# Patient Record
Sex: Male | Born: 1940
Health system: Southern US, Community
[De-identification: ages and names within clinical notes are randomized; demographics above are authoritative.]

## PROBLEM LIST (undated history)

## (undated) DIAGNOSIS — I219 Acute myocardial infarction, unspecified: Secondary | ICD-10-CM

## (undated) DIAGNOSIS — K2270 Barrett's esophagus without dysplasia: Secondary | ICD-10-CM

## (undated) DIAGNOSIS — I714 Abdominal aortic aneurysm, without rupture: Secondary | ICD-10-CM

## (undated) HISTORY — DX: Acute myocardial infarction, unspecified: I21.9

## (undated) HISTORY — DX: Abdominal aortic aneurysm, without rupture: I71.4

## (undated) HISTORY — PX: OTHER SURGICAL HISTORY: SHX169

## (undated) HISTORY — DX: Barrett's esophagus without dysplasia: K22.70

## (undated) HISTORY — PX: RENAL ARTERY STENT: SHX2321

## (undated) HISTORY — PX: ABDOMINAL AORTIC ANEURYSM REPAIR: SUR1152

---

## 2011-02-01 DIAGNOSIS — R69 Illness, unspecified: Secondary | ICD-10-CM | POA: Insufficient documentation

## 2011-07-23 DIAGNOSIS — E782 Mixed hyperlipidemia: Secondary | ICD-10-CM | POA: Diagnosis not present

## 2011-07-23 DIAGNOSIS — I714 Abdominal aortic aneurysm, without rupture: Secondary | ICD-10-CM | POA: Diagnosis not present

## 2011-07-23 DIAGNOSIS — I10 Essential (primary) hypertension: Secondary | ICD-10-CM | POA: Diagnosis not present

## 2011-07-23 DIAGNOSIS — M546 Pain in thoracic spine: Secondary | ICD-10-CM | POA: Diagnosis not present

## 2011-07-25 DIAGNOSIS — M546 Pain in thoracic spine: Secondary | ICD-10-CM | POA: Diagnosis not present

## 2011-07-25 DIAGNOSIS — M19019 Primary osteoarthritis, unspecified shoulder: Secondary | ICD-10-CM | POA: Diagnosis not present

## 2011-07-25 DIAGNOSIS — IMO0002 Reserved for concepts with insufficient information to code with codable children: Secondary | ICD-10-CM | POA: Diagnosis not present

## 2011-07-25 DIAGNOSIS — M25519 Pain in unspecified shoulder: Secondary | ICD-10-CM | POA: Diagnosis not present

## 2011-08-01 DIAGNOSIS — M19019 Primary osteoarthritis, unspecified shoulder: Secondary | ICD-10-CM | POA: Diagnosis not present

## 2011-08-01 DIAGNOSIS — M67919 Unspecified disorder of synovium and tendon, unspecified shoulder: Secondary | ICD-10-CM | POA: Diagnosis not present

## 2011-08-01 DIAGNOSIS — M25519 Pain in unspecified shoulder: Secondary | ICD-10-CM | POA: Diagnosis not present

## 2011-08-01 DIAGNOSIS — M659 Synovitis and tenosynovitis, unspecified: Secondary | ICD-10-CM | POA: Diagnosis not present

## 2011-08-01 DIAGNOSIS — M719 Bursopathy, unspecified: Secondary | ICD-10-CM | POA: Diagnosis not present

## 2011-08-20 DIAGNOSIS — M25519 Pain in unspecified shoulder: Secondary | ICD-10-CM | POA: Diagnosis not present

## 2011-08-20 DIAGNOSIS — M19019 Primary osteoarthritis, unspecified shoulder: Secondary | ICD-10-CM | POA: Diagnosis not present

## 2011-11-28 DIAGNOSIS — L821 Other seborrheic keratosis: Secondary | ICD-10-CM | POA: Diagnosis not present

## 2011-11-28 DIAGNOSIS — H6980 Other specified disorders of Eustachian tube, unspecified ear: Secondary | ICD-10-CM | POA: Diagnosis not present

## 2011-11-28 DIAGNOSIS — Z23 Encounter for immunization: Secondary | ICD-10-CM | POA: Diagnosis not present

## 2011-11-28 DIAGNOSIS — I10 Essential (primary) hypertension: Secondary | ICD-10-CM | POA: Diagnosis not present

## 2011-11-28 DIAGNOSIS — I714 Abdominal aortic aneurysm, without rupture: Secondary | ICD-10-CM | POA: Diagnosis not present

## 2011-11-28 DIAGNOSIS — R42 Dizziness and giddiness: Secondary | ICD-10-CM | POA: Diagnosis not present

## 2011-11-28 DIAGNOSIS — H6990 Unspecified Eustachian tube disorder, unspecified ear: Secondary | ICD-10-CM | POA: Diagnosis not present

## 2011-12-05 DIAGNOSIS — I714 Abdominal aortic aneurysm, without rupture: Secondary | ICD-10-CM | POA: Diagnosis not present

## 2012-01-01 DIAGNOSIS — I498 Other specified cardiac arrhythmias: Secondary | ICD-10-CM | POA: Diagnosis not present

## 2012-01-01 DIAGNOSIS — I252 Old myocardial infarction: Secondary | ICD-10-CM | POA: Diagnosis not present

## 2012-01-01 DIAGNOSIS — I714 Abdominal aortic aneurysm, without rupture: Secondary | ICD-10-CM | POA: Diagnosis not present

## 2012-01-01 DIAGNOSIS — I10 Essential (primary) hypertension: Secondary | ICD-10-CM | POA: Diagnosis not present

## 2012-01-01 DIAGNOSIS — I447 Left bundle-branch block, unspecified: Secondary | ICD-10-CM | POA: Diagnosis not present

## 2012-01-23 DIAGNOSIS — I6529 Occlusion and stenosis of unspecified carotid artery: Secondary | ICD-10-CM | POA: Diagnosis not present

## 2012-01-23 DIAGNOSIS — I70219 Atherosclerosis of native arteries of extremities with intermittent claudication, unspecified extremity: Secondary | ICD-10-CM | POA: Diagnosis not present

## 2012-01-23 DIAGNOSIS — I714 Abdominal aortic aneurysm, without rupture: Secondary | ICD-10-CM | POA: Diagnosis not present

## 2012-01-23 DIAGNOSIS — I252 Old myocardial infarction: Secondary | ICD-10-CM | POA: Diagnosis not present

## 2012-01-23 DIAGNOSIS — R0989 Other specified symptoms and signs involving the circulatory and respiratory systems: Secondary | ICD-10-CM | POA: Diagnosis not present

## 2012-01-23 DIAGNOSIS — I701 Atherosclerosis of renal artery: Secondary | ICD-10-CM | POA: Diagnosis not present

## 2012-01-23 DIAGNOSIS — R9431 Abnormal electrocardiogram [ECG] [EKG]: Secondary | ICD-10-CM | POA: Diagnosis not present

## 2012-01-23 DIAGNOSIS — I498 Other specified cardiac arrhythmias: Secondary | ICD-10-CM | POA: Diagnosis not present

## 2012-02-05 DIAGNOSIS — E7800 Pure hypercholesterolemia, unspecified: Secondary | ICD-10-CM | POA: Diagnosis not present

## 2012-02-05 DIAGNOSIS — Z8709 Personal history of other diseases of the respiratory system: Secondary | ICD-10-CM | POA: Diagnosis not present

## 2012-02-05 DIAGNOSIS — I701 Atherosclerosis of renal artery: Secondary | ICD-10-CM | POA: Diagnosis not present

## 2012-02-05 DIAGNOSIS — Z8679 Personal history of other diseases of the circulatory system: Secondary | ICD-10-CM | POA: Diagnosis not present

## 2012-02-05 DIAGNOSIS — I10 Essential (primary) hypertension: Secondary | ICD-10-CM | POA: Diagnosis not present

## 2012-02-05 DIAGNOSIS — I714 Abdominal aortic aneurysm, without rupture: Secondary | ICD-10-CM | POA: Diagnosis not present

## 2012-02-13 DIAGNOSIS — I714 Abdominal aortic aneurysm, without rupture: Secondary | ICD-10-CM | POA: Diagnosis not present

## 2012-02-13 DIAGNOSIS — I701 Atherosclerosis of renal artery: Secondary | ICD-10-CM | POA: Diagnosis not present

## 2012-02-13 DIAGNOSIS — Q278 Other specified congenital malformations of peripheral vascular system: Secondary | ICD-10-CM | POA: Diagnosis not present

## 2012-02-22 DIAGNOSIS — I10 Essential (primary) hypertension: Secondary | ICD-10-CM | POA: Diagnosis not present

## 2012-02-22 DIAGNOSIS — I2510 Atherosclerotic heart disease of native coronary artery without angina pectoris: Secondary | ICD-10-CM | POA: Diagnosis not present

## 2012-02-22 DIAGNOSIS — I701 Atherosclerosis of renal artery: Secondary | ICD-10-CM | POA: Diagnosis not present

## 2012-02-22 DIAGNOSIS — J449 Chronic obstructive pulmonary disease, unspecified: Secondary | ICD-10-CM | POA: Diagnosis not present

## 2012-02-22 DIAGNOSIS — I714 Abdominal aortic aneurysm, without rupture: Secondary | ICD-10-CM | POA: Diagnosis not present

## 2012-02-22 DIAGNOSIS — E785 Hyperlipidemia, unspecified: Secondary | ICD-10-CM | POA: Diagnosis not present

## 2012-02-27 DIAGNOSIS — I714 Abdominal aortic aneurysm, without rupture, unspecified: Secondary | ICD-10-CM | POA: Diagnosis not present

## 2012-02-27 DIAGNOSIS — I2510 Atherosclerotic heart disease of native coronary artery without angina pectoris: Secondary | ICD-10-CM | POA: Diagnosis present

## 2012-02-27 DIAGNOSIS — I10 Essential (primary) hypertension: Secondary | ICD-10-CM | POA: Diagnosis present

## 2012-02-27 DIAGNOSIS — I701 Atherosclerosis of renal artery: Secondary | ICD-10-CM | POA: Diagnosis present

## 2012-02-27 DIAGNOSIS — E785 Hyperlipidemia, unspecified: Secondary | ICD-10-CM | POA: Diagnosis present

## 2012-02-27 DIAGNOSIS — J449 Chronic obstructive pulmonary disease, unspecified: Secondary | ICD-10-CM | POA: Diagnosis present

## 2012-04-08 DIAGNOSIS — I714 Abdominal aortic aneurysm, without rupture, unspecified: Secondary | ICD-10-CM | POA: Diagnosis not present

## 2012-04-08 DIAGNOSIS — Z9889 Other specified postprocedural states: Secondary | ICD-10-CM | POA: Diagnosis not present

## 2012-04-08 DIAGNOSIS — N2889 Other specified disorders of kidney and ureter: Secondary | ICD-10-CM | POA: Diagnosis not present

## 2012-04-11 DIAGNOSIS — M999 Biomechanical lesion, unspecified: Secondary | ICD-10-CM | POA: Diagnosis not present

## 2012-04-11 DIAGNOSIS — S335XXA Sprain of ligaments of lumbar spine, initial encounter: Secondary | ICD-10-CM | POA: Diagnosis not present

## 2012-06-02 DIAGNOSIS — Z86010 Personal history of colonic polyps: Secondary | ICD-10-CM | POA: Diagnosis not present

## 2012-06-02 DIAGNOSIS — K2270 Barrett's esophagus without dysplasia: Secondary | ICD-10-CM | POA: Diagnosis not present

## 2012-06-02 DIAGNOSIS — M9981 Other biomechanical lesions of cervical region: Secondary | ICD-10-CM | POA: Diagnosis not present

## 2012-06-02 DIAGNOSIS — M999 Biomechanical lesion, unspecified: Secondary | ICD-10-CM | POA: Diagnosis not present

## 2012-06-25 DIAGNOSIS — K2270 Barrett's esophagus without dysplasia: Secondary | ICD-10-CM | POA: Diagnosis not present

## 2012-06-25 DIAGNOSIS — H539 Unspecified visual disturbance: Secondary | ICD-10-CM | POA: Diagnosis not present

## 2012-06-25 DIAGNOSIS — Z85038 Personal history of other malignant neoplasm of large intestine: Secondary | ICD-10-CM | POA: Diagnosis not present

## 2012-06-25 DIAGNOSIS — R0989 Other specified symptoms and signs involving the circulatory and respiratory systems: Secondary | ICD-10-CM | POA: Diagnosis not present

## 2012-06-27 DIAGNOSIS — I6529 Occlusion and stenosis of unspecified carotid artery: Secondary | ICD-10-CM | POA: Diagnosis not present

## 2012-06-27 DIAGNOSIS — R0989 Other specified symptoms and signs involving the circulatory and respiratory systems: Secondary | ICD-10-CM | POA: Diagnosis not present

## 2012-06-27 DIAGNOSIS — G459 Transient cerebral ischemic attack, unspecified: Secondary | ICD-10-CM | POA: Diagnosis not present

## 2012-07-01 DIAGNOSIS — K5730 Diverticulosis of large intestine without perforation or abscess without bleeding: Secondary | ICD-10-CM | POA: Diagnosis not present

## 2012-07-01 DIAGNOSIS — D130 Benign neoplasm of esophagus: Secondary | ICD-10-CM | POA: Diagnosis not present

## 2012-07-01 DIAGNOSIS — D126 Benign neoplasm of colon, unspecified: Secondary | ICD-10-CM | POA: Diagnosis not present

## 2012-07-01 DIAGNOSIS — Z09 Encounter for follow-up examination after completed treatment for conditions other than malignant neoplasm: Secondary | ICD-10-CM | POA: Diagnosis not present

## 2012-07-01 DIAGNOSIS — Z85038 Personal history of other malignant neoplasm of large intestine: Secondary | ICD-10-CM | POA: Diagnosis not present

## 2012-07-01 DIAGNOSIS — K2270 Barrett's esophagus without dysplasia: Secondary | ICD-10-CM | POA: Diagnosis not present

## 2012-07-10 DIAGNOSIS — K2270 Barrett's esophagus without dysplasia: Secondary | ICD-10-CM | POA: Diagnosis not present

## 2012-07-10 DIAGNOSIS — Z85038 Personal history of other malignant neoplasm of large intestine: Secondary | ICD-10-CM | POA: Diagnosis not present

## 2012-07-10 DIAGNOSIS — M545 Low back pain: Secondary | ICD-10-CM | POA: Diagnosis not present

## 2012-07-10 DIAGNOSIS — D126 Benign neoplasm of colon, unspecified: Secondary | ICD-10-CM | POA: Diagnosis not present

## 2012-07-17 DIAGNOSIS — M545 Low back pain: Secondary | ICD-10-CM | POA: Diagnosis not present

## 2012-07-24 DIAGNOSIS — M5126 Other intervertebral disc displacement, lumbar region: Secondary | ICD-10-CM | POA: Diagnosis not present

## 2012-07-24 DIAGNOSIS — M48061 Spinal stenosis, lumbar region without neurogenic claudication: Secondary | ICD-10-CM | POA: Diagnosis not present

## 2012-07-24 DIAGNOSIS — I714 Abdominal aortic aneurysm, without rupture: Secondary | ICD-10-CM | POA: Diagnosis not present

## 2012-07-24 DIAGNOSIS — M5137 Other intervertebral disc degeneration, lumbosacral region: Secondary | ICD-10-CM | POA: Diagnosis not present

## 2012-07-24 DIAGNOSIS — M545 Low back pain: Secondary | ICD-10-CM | POA: Diagnosis not present

## 2012-07-24 DIAGNOSIS — M47817 Spondylosis without myelopathy or radiculopathy, lumbosacral region: Secondary | ICD-10-CM | POA: Diagnosis not present

## 2012-07-30 DIAGNOSIS — M5126 Other intervertebral disc displacement, lumbar region: Secondary | ICD-10-CM | POA: Diagnosis not present

## 2012-07-30 DIAGNOSIS — M48061 Spinal stenosis, lumbar region without neurogenic claudication: Secondary | ICD-10-CM | POA: Diagnosis not present

## 2012-07-30 DIAGNOSIS — IMO0002 Reserved for concepts with insufficient information to code with codable children: Secondary | ICD-10-CM | POA: Diagnosis not present

## 2012-08-06 DIAGNOSIS — Z09 Encounter for follow-up examination after completed treatment for conditions other than malignant neoplasm: Secondary | ICD-10-CM | POA: Diagnosis not present

## 2012-08-06 DIAGNOSIS — IMO0002 Reserved for concepts with insufficient information to code with codable children: Secondary | ICD-10-CM | POA: Diagnosis not present

## 2012-08-15 DIAGNOSIS — IMO0002 Reserved for concepts with insufficient information to code with codable children: Secondary | ICD-10-CM | POA: Diagnosis not present

## 2012-08-15 DIAGNOSIS — M5126 Other intervertebral disc displacement, lumbar region: Secondary | ICD-10-CM | POA: Diagnosis not present

## 2012-08-15 DIAGNOSIS — M48061 Spinal stenosis, lumbar region without neurogenic claudication: Secondary | ICD-10-CM | POA: Diagnosis not present

## 2012-08-27 DIAGNOSIS — IMO0002 Reserved for concepts with insufficient information to code with codable children: Secondary | ICD-10-CM | POA: Diagnosis not present

## 2012-09-02 DIAGNOSIS — M48061 Spinal stenosis, lumbar region without neurogenic claudication: Secondary | ICD-10-CM | POA: Diagnosis not present

## 2012-09-02 DIAGNOSIS — M5126 Other intervertebral disc displacement, lumbar region: Secondary | ICD-10-CM | POA: Diagnosis not present

## 2012-09-02 DIAGNOSIS — IMO0002 Reserved for concepts with insufficient information to code with codable children: Secondary | ICD-10-CM | POA: Diagnosis not present

## 2013-02-10 DIAGNOSIS — I10 Essential (primary) hypertension: Secondary | ICD-10-CM | POA: Diagnosis not present

## 2013-02-10 DIAGNOSIS — M25519 Pain in unspecified shoulder: Secondary | ICD-10-CM | POA: Diagnosis not present

## 2013-02-10 DIAGNOSIS — E785 Hyperlipidemia, unspecified: Secondary | ICD-10-CM | POA: Diagnosis not present

## 2013-03-17 DIAGNOSIS — I10 Essential (primary) hypertension: Secondary | ICD-10-CM | POA: Diagnosis not present

## 2013-03-17 DIAGNOSIS — E785 Hyperlipidemia, unspecified: Secondary | ICD-10-CM | POA: Diagnosis not present

## 2013-03-17 DIAGNOSIS — I2510 Atherosclerotic heart disease of native coronary artery without angina pectoris: Secondary | ICD-10-CM | POA: Diagnosis not present

## 2013-03-24 DIAGNOSIS — I10 Essential (primary) hypertension: Secondary | ICD-10-CM | POA: Diagnosis not present

## 2013-03-31 DIAGNOSIS — C50029 Malignant neoplasm of nipple and areola, unspecified male breast: Secondary | ICD-10-CM | POA: Diagnosis not present

## 2013-04-01 DIAGNOSIS — J00 Acute nasopharyngitis [common cold]: Secondary | ICD-10-CM | POA: Diagnosis not present

## 2013-04-16 DIAGNOSIS — K2270 Barrett's esophagus without dysplasia: Secondary | ICD-10-CM | POA: Diagnosis not present

## 2013-04-16 DIAGNOSIS — D249 Benign neoplasm of unspecified breast: Secondary | ICD-10-CM | POA: Diagnosis not present

## 2013-04-16 DIAGNOSIS — D235 Other benign neoplasm of skin of trunk: Secondary | ICD-10-CM | POA: Diagnosis not present

## 2013-04-16 DIAGNOSIS — K219 Gastro-esophageal reflux disease without esophagitis: Secondary | ICD-10-CM | POA: Diagnosis not present

## 2013-06-17 DIAGNOSIS — I10 Essential (primary) hypertension: Secondary | ICD-10-CM | POA: Diagnosis not present

## 2013-06-17 DIAGNOSIS — I2510 Atherosclerotic heart disease of native coronary artery without angina pectoris: Secondary | ICD-10-CM | POA: Diagnosis not present

## 2013-06-25 DIAGNOSIS — I10 Essential (primary) hypertension: Secondary | ICD-10-CM | POA: Diagnosis not present

## 2013-08-18 DIAGNOSIS — R42 Dizziness and giddiness: Secondary | ICD-10-CM | POA: Diagnosis not present

## 2013-09-07 DIAGNOSIS — L02519 Cutaneous abscess of unspecified hand: Secondary | ICD-10-CM | POA: Diagnosis not present

## 2013-09-07 DIAGNOSIS — L03119 Cellulitis of unspecified part of limb: Secondary | ICD-10-CM | POA: Diagnosis not present

## 2013-09-25 DIAGNOSIS — I10 Essential (primary) hypertension: Secondary | ICD-10-CM | POA: Diagnosis not present

## 2013-09-25 DIAGNOSIS — Z23 Encounter for immunization: Secondary | ICD-10-CM | POA: Diagnosis not present

## 2013-09-25 DIAGNOSIS — I2510 Atherosclerotic heart disease of native coronary artery without angina pectoris: Secondary | ICD-10-CM | POA: Diagnosis not present

## 2013-11-12 DIAGNOSIS — K219 Gastro-esophageal reflux disease without esophagitis: Secondary | ICD-10-CM | POA: Diagnosis not present

## 2013-11-12 DIAGNOSIS — K2270 Barrett's esophagus without dysplasia: Secondary | ICD-10-CM | POA: Diagnosis not present

## 2013-11-12 DIAGNOSIS — I714 Abdominal aortic aneurysm, without rupture: Secondary | ICD-10-CM | POA: Diagnosis not present

## 2013-11-17 DIAGNOSIS — I714 Abdominal aortic aneurysm, without rupture: Secondary | ICD-10-CM | POA: Diagnosis not present

## 2013-12-14 ENCOUNTER — Encounter: Payer: Self-pay | Admitting: Surgery

## 2013-12-14 DIAGNOSIS — I10 Essential (primary) hypertension: Secondary | ICD-10-CM | POA: Diagnosis not present

## 2013-12-14 DIAGNOSIS — K449 Diaphragmatic hernia without obstruction or gangrene: Secondary | ICD-10-CM | POA: Diagnosis not present

## 2013-12-14 DIAGNOSIS — K2270 Barrett's esophagus without dysplasia: Secondary | ICD-10-CM | POA: Diagnosis not present

## 2013-12-14 DIAGNOSIS — I2510 Atherosclerotic heart disease of native coronary artery without angina pectoris: Secondary | ICD-10-CM | POA: Diagnosis not present

## 2013-12-14 DIAGNOSIS — K219 Gastro-esophageal reflux disease without esophagitis: Secondary | ICD-10-CM | POA: Diagnosis not present

## 2013-12-14 DIAGNOSIS — Z87891 Personal history of nicotine dependence: Secondary | ICD-10-CM | POA: Diagnosis not present

## 2013-12-23 ENCOUNTER — Encounter: Payer: Self-pay | Admitting: Vascular Surgery

## 2013-12-24 ENCOUNTER — Encounter: Payer: Self-pay | Admitting: Vascular Surgery

## 2013-12-24 ENCOUNTER — Ambulatory Visit (INDEPENDENT_AMBULATORY_CARE_PROVIDER_SITE_OTHER): Payer: Medicare Other | Admitting: Vascular Surgery

## 2013-12-24 VITALS — BP 157/82 | HR 73 | Ht 72.0 in | Wt 213.0 lb

## 2013-12-24 DIAGNOSIS — Z48812 Encounter for surgical aftercare following surgery on the circulatory system: Secondary | ICD-10-CM | POA: Diagnosis not present

## 2013-12-24 DIAGNOSIS — I714 Abdominal aortic aneurysm, without rupture, unspecified: Secondary | ICD-10-CM

## 2013-12-24 DIAGNOSIS — I6523 Occlusion and stenosis of bilateral carotid arteries: Secondary | ICD-10-CM

## 2013-12-24 DIAGNOSIS — K2270 Barrett's esophagus without dysplasia: Secondary | ICD-10-CM

## 2013-12-24 HISTORY — DX: Abdominal aortic aneurysm, without rupture: I71.4

## 2013-12-24 HISTORY — DX: Abdominal aortic aneurysm, without rupture, unspecified: I71.40

## 2013-12-24 NOTE — Addendum Note (Signed)
Addended by: Mena Goes on: 12/24/2013 11:28 AM   Modules accepted: Orders

## 2013-12-24 NOTE — Progress Notes (Addendum)
[facility] [place] HISTORY AND PHYSICAL   History of Present Illness:  Patient is a 73 y.o. year old male who presents for evaluation of abdominal aortic aneurysm. The patient had aneurysm stent graft repair and renal artery stenting in [place] in February 2014. He recently moved to the area and is here for establishment of care. He denies any abdominal or back pain. He does complain of chronic numbness in the anterior portion of his left thigh. He also has occasional numbness and tingling in his left foot.  Other medical problems include history of Barrett's esophagus and coronary artery disease with remote silent myocardial infarction.   Past Medical History  Diagnosis Date  . Myocardial infarction   . AAA (abdominal aortic aneurysm)   . Barrett's esophagus      Past Surgical History  Procedure Laterality Date  . Evar    . Renal artery stent      Social History History  Substance Use Topics  . Smoking status: Former Smoker    Types: Cigarettes    Quit date: 01/16/2007  . Smokeless tobacco: Not on file  . Alcohol Use: 3.6 - 4.8 oz/week    3-4 Glasses of wine, 3-4 Cans of beer per week    Family History Family History  Problem Relation Age of Onset  . Diabetes Mother   . Hypertension Mother   . Varicose Veins Mother   . Heart disease Mother     before age 11    Allergies  Not on File   Current Outpatient Prescriptions  Medication Sig Dispense Refill  . amLODipine (NORVASC) 10 MG tablet Take 10 mg by mouth daily.  5  . atorvastatin (LIPITOR) 40 MG tablet Take 40 mg by mouth daily.  6  . losartan-hydrochlorothiazide (HYZAAR) 50-12.5 MG per tablet Take 2 tablets by mouth daily.  6  . omeprazole (PRILOSEC) 20 MG capsule Take 20 mg by mouth every morning.  10   No current facility-administered medications for this visit.    ROS:   General:  No weight loss, Fever, chills  HEENT: No recent headaches, no nasal bleeding, no visual  changes, no sore throat  Neurologic: No dizziness, blackouts, seizures. No recent symptoms of stroke or mini- stroke. No recent episodes of slurred speech, or temporary blindness.  Cardiac: No recent episodes of chest pain/pressure, no shortness of breath at rest.  No shortness of breath with exertion.  Denies history of atrial fibrillation or irregular heartbeat  Vascular: No history of rest pain in feet.  No history of claudication.  No history of non-healing ulcer, No history of DVT   Pulmonary: No home oxygen, no productive cough, no hemoptysis,  No asthma or wheezing  Musculoskeletal:  [ ]  Arthritis, [ ]  Low back pain,  [ ]  Joint pain  Hematologic:No history of hypercoagulable state.  No history of easy bleeding.  No history of anemia  Gastrointestinal: No hematochezia or melena,  + gastroesophageal reflux, no trouble swallowing  Urinary: [ ]  chronic Kidney disease, [ ]  on HD - [ ]  MWF or [ ]  TTHS, [ ]  Burning with urination, [ ]  Frequent urination, [ ]  Difficulty urinating;   Skin: No rashes  Psychological: No history of anxiety,  No history of depression   Physical Examination  Filed Vitals:   12/24/13 0905  BP: 157/82  Pulse: 73  Height: 6' (1.829 m)  Weight: 213 lb (96.616 kg)  SpO2: 96%    Body mass index is 28.88 kg/(m^2).  General:  Alert and oriented, no acute distress HEENT: Normal Neck: 2+ carotid pulses, soft left carotid bruit, no JVD Pulmonary: Clear to auscultation bilaterally Cardiac: Regular Rate and Rhythm without murmur Abdomen: Soft, non-tender, non-distended, no mass, no scars Skin: No rash Extremity Pulses:  2+ radial, brachial, femoral, right 2+ dorsalis pedis, left 2+ posterior tibial pulses Musculoskeletal: No deformity or edema  Neurologic: Upper and lower extremity motor 5/5 and symmetric, 5 x 5 cm area just above the patella left leg with slightly decreased sensation to light touch  DATA:  Ultrasound from [facility] from several  weeks ago is reviewed today. This shows a 5 x 4.2 cm abdominal aortic aneurysm with a stent graft in place. I reviewed these images today.   ASSESSMENT:  Status post aneurysm stent graft repair. Left carotid bruit.   PLAN:  We will obtain his operative note records from [facility]. We will also obtain his carotid duplex scan from [facility] from 2014.  He'll return in 6 months for a CT angiogram of the abdomen and pelvis to define the specific anatomy of his stent graft. He will also have a carotid duplex exam at that appointment.  Consideration also needs to be given for starting the patient on daily aspirin with his history of coronary disease.  Ruta Hinds, MD [facility] [place] Office: [phone] Pager: [phone]  Addendum: Medical records from patient's [facility] reviewed on December 16.   Carotid occlusive disease: Patient had a carotid duplex scan dated 06/27/2012 which showed less than 50% right and left internal carotid artery stenosis with antegrade vertebral flow  Abdominal aortic aneurysm: Patient had a Gore Excluder stent graft placed on 02/27/2012. This was a 26 x 14.5 x 14 cm Gore Excluder graft. The contralateral limb was a 16 x 14.5 x 12 cm length limb and ipsilateral extension was placed which was a 16 mm x 18 mm x 9.5 cm length limb   CT Angio post procedure on 04/07/2012 showed aneurysm sac 4.9 x 4.8 cm with a patent left renal artery stent no endoleak, left inferior renal pole infarction after coverage of accessory left renal artery.  Patient's aneurysm was 5 x 4.9 preoperatively.  Renal artery stenosis left renal artery stent placed on 02/13/2012.  Ruta Hinds, MD [facility] [place] Office: [phone] Pager: [phone]

## 2013-12-28 DIAGNOSIS — E785 Hyperlipidemia, unspecified: Secondary | ICD-10-CM | POA: Diagnosis not present

## 2013-12-28 DIAGNOSIS — I10 Essential (primary) hypertension: Secondary | ICD-10-CM | POA: Diagnosis not present

## 2014-03-29 DIAGNOSIS — M25512 Pain in left shoulder: Secondary | ICD-10-CM | POA: Diagnosis not present

## 2014-03-29 DIAGNOSIS — K209 Esophagitis, unspecified: Secondary | ICD-10-CM | POA: Diagnosis not present

## 2014-03-29 DIAGNOSIS — I10 Essential (primary) hypertension: Secondary | ICD-10-CM | POA: Diagnosis not present

## 2014-03-29 DIAGNOSIS — E669 Obesity, unspecified: Secondary | ICD-10-CM | POA: Diagnosis not present

## 2014-06-01 DIAGNOSIS — J310 Chronic rhinitis: Secondary | ICD-10-CM | POA: Diagnosis not present

## 2014-06-21 DIAGNOSIS — J310 Chronic rhinitis: Secondary | ICD-10-CM | POA: Diagnosis not present

## 2014-07-27 DIAGNOSIS — J310 Chronic rhinitis: Secondary | ICD-10-CM | POA: Diagnosis not present

## 2014-07-27 DIAGNOSIS — J342 Deviated nasal septum: Secondary | ICD-10-CM | POA: Diagnosis not present

## 2014-07-27 DIAGNOSIS — J3489 Other specified disorders of nose and nasal sinuses: Secondary | ICD-10-CM | POA: Diagnosis not present

## 2014-07-27 DIAGNOSIS — J343 Hypertrophy of nasal turbinates: Secondary | ICD-10-CM | POA: Diagnosis not present

## 2014-08-04 DIAGNOSIS — I2510 Atherosclerotic heart disease of native coronary artery without angina pectoris: Secondary | ICD-10-CM | POA: Diagnosis not present

## 2014-08-04 DIAGNOSIS — J310 Chronic rhinitis: Secondary | ICD-10-CM | POA: Diagnosis not present

## 2014-08-04 DIAGNOSIS — E785 Hyperlipidemia, unspecified: Secondary | ICD-10-CM | POA: Diagnosis not present

## 2014-08-04 DIAGNOSIS — J342 Deviated nasal septum: Secondary | ICD-10-CM | POA: Diagnosis not present

## 2014-08-04 DIAGNOSIS — Z87891 Personal history of nicotine dependence: Secondary | ICD-10-CM | POA: Diagnosis not present

## 2014-08-04 DIAGNOSIS — K219 Gastro-esophageal reflux disease without esophagitis: Secondary | ICD-10-CM | POA: Diagnosis not present

## 2014-08-04 DIAGNOSIS — Z79899 Other long term (current) drug therapy: Secondary | ICD-10-CM | POA: Diagnosis not present

## 2014-08-04 DIAGNOSIS — J343 Hypertrophy of nasal turbinates: Secondary | ICD-10-CM | POA: Diagnosis not present

## 2014-08-04 DIAGNOSIS — I10 Essential (primary) hypertension: Secondary | ICD-10-CM | POA: Diagnosis not present

## 2014-08-04 DIAGNOSIS — J3489 Other specified disorders of nose and nasal sinuses: Secondary | ICD-10-CM | POA: Diagnosis not present

## 2014-10-04 DIAGNOSIS — E785 Hyperlipidemia, unspecified: Secondary | ICD-10-CM | POA: Diagnosis not present

## 2014-10-04 DIAGNOSIS — Z23 Encounter for immunization: Secondary | ICD-10-CM | POA: Diagnosis not present

## 2014-10-04 DIAGNOSIS — I10 Essential (primary) hypertension: Secondary | ICD-10-CM | POA: Diagnosis not present

## 2014-10-04 DIAGNOSIS — K209 Esophagitis, unspecified: Secondary | ICD-10-CM | POA: Diagnosis not present

## 2014-12-30 ENCOUNTER — Encounter (HOSPITAL_COMMUNITY): Payer: Medicare Other

## 2014-12-30 ENCOUNTER — Other Ambulatory Visit: Payer: Medicare Other

## 2014-12-30 ENCOUNTER — Ambulatory Visit: Payer: Medicare Other | Admitting: Vascular Surgery

## 2015-02-09 DIAGNOSIS — E669 Obesity, unspecified: Secondary | ICD-10-CM | POA: Diagnosis not present

## 2015-02-09 DIAGNOSIS — K209 Esophagitis, unspecified: Secondary | ICD-10-CM | POA: Diagnosis not present

## 2015-02-09 DIAGNOSIS — E785 Hyperlipidemia, unspecified: Secondary | ICD-10-CM | POA: Diagnosis not present

## 2015-02-09 DIAGNOSIS — R05 Cough: Secondary | ICD-10-CM | POA: Diagnosis not present

## 2015-02-09 DIAGNOSIS — I10 Essential (primary) hypertension: Secondary | ICD-10-CM | POA: Diagnosis not present

## 2015-02-09 DIAGNOSIS — Z6829 Body mass index (BMI) 29.0-29.9, adult: Secondary | ICD-10-CM | POA: Diagnosis not present

## 2015-02-09 DIAGNOSIS — I2510 Atherosclerotic heart disease of native coronary artery without angina pectoris: Secondary | ICD-10-CM | POA: Diagnosis not present

## 2015-02-09 DIAGNOSIS — Z125 Encounter for screening for malignant neoplasm of prostate: Secondary | ICD-10-CM | POA: Diagnosis not present

## 2015-02-18 ENCOUNTER — Encounter: Payer: Self-pay | Admitting: Vascular Surgery

## 2015-02-21 ENCOUNTER — Inpatient Hospital Stay: Admission: RE | Admit: 2015-02-21 | Payer: Medicare Other | Source: Ambulatory Visit

## 2015-02-22 DIAGNOSIS — J209 Acute bronchitis, unspecified: Secondary | ICD-10-CM | POA: Diagnosis not present

## 2015-02-23 DIAGNOSIS — R918 Other nonspecific abnormal finding of lung field: Secondary | ICD-10-CM | POA: Diagnosis not present

## 2015-02-23 DIAGNOSIS — R05 Cough: Secondary | ICD-10-CM | POA: Diagnosis not present

## 2015-02-23 DIAGNOSIS — R509 Fever, unspecified: Secondary | ICD-10-CM | POA: Diagnosis not present

## 2015-02-23 DIAGNOSIS — J849 Interstitial pulmonary disease, unspecified: Secondary | ICD-10-CM | POA: Diagnosis not present

## 2015-02-24 ENCOUNTER — Encounter (HOSPITAL_COMMUNITY): Payer: Medicare Other

## 2015-02-24 ENCOUNTER — Ambulatory Visit: Payer: Medicare Other | Admitting: Vascular Surgery

## 2015-03-03 DIAGNOSIS — R062 Wheezing: Secondary | ICD-10-CM | POA: Diagnosis not present

## 2015-03-03 DIAGNOSIS — J209 Acute bronchitis, unspecified: Secondary | ICD-10-CM | POA: Diagnosis not present

## 2015-03-03 DIAGNOSIS — R918 Other nonspecific abnormal finding of lung field: Secondary | ICD-10-CM | POA: Diagnosis not present

## 2015-03-03 DIAGNOSIS — R05 Cough: Secondary | ICD-10-CM | POA: Diagnosis not present

## 2015-03-03 DIAGNOSIS — I700 Atherosclerosis of aorta: Secondary | ICD-10-CM | POA: Diagnosis not present

## 2015-03-04 ENCOUNTER — Encounter: Payer: Self-pay | Admitting: Family Medicine

## 2015-03-11 DIAGNOSIS — R0602 Shortness of breath: Secondary | ICD-10-CM | POA: Diagnosis not present

## 2015-04-28 DIAGNOSIS — S46111A Strain of muscle, fascia and tendon of long head of biceps, right arm, initial encounter: Secondary | ICD-10-CM | POA: Diagnosis not present

## 2015-04-28 DIAGNOSIS — M79601 Pain in right arm: Secondary | ICD-10-CM | POA: Diagnosis not present

## 2015-04-28 DIAGNOSIS — M25521 Pain in right elbow: Secondary | ICD-10-CM | POA: Diagnosis not present

## 2015-05-04 DIAGNOSIS — X58XXXA Exposure to other specified factors, initial encounter: Secondary | ICD-10-CM | POA: Diagnosis not present

## 2015-05-04 DIAGNOSIS — M25521 Pain in right elbow: Secondary | ICD-10-CM | POA: Diagnosis not present

## 2015-05-04 DIAGNOSIS — M79601 Pain in right arm: Secondary | ICD-10-CM | POA: Diagnosis not present

## 2015-05-04 DIAGNOSIS — S46211A Strain of muscle, fascia and tendon of other parts of biceps, right arm, initial encounter: Secondary | ICD-10-CM | POA: Diagnosis not present

## 2015-05-04 DIAGNOSIS — M25421 Effusion, right elbow: Secondary | ICD-10-CM | POA: Diagnosis not present

## 2015-05-06 DIAGNOSIS — S46111A Strain of muscle, fascia and tendon of long head of biceps, right arm, initial encounter: Secondary | ICD-10-CM | POA: Diagnosis not present

## 2015-05-06 DIAGNOSIS — M79601 Pain in right arm: Secondary | ICD-10-CM | POA: Diagnosis not present

## 2015-05-17 DIAGNOSIS — M5441 Lumbago with sciatica, right side: Secondary | ICD-10-CM | POA: Diagnosis not present

## 2015-05-27 DIAGNOSIS — S46111A Strain of muscle, fascia and tendon of long head of biceps, right arm, initial encounter: Secondary | ICD-10-CM | POA: Diagnosis not present

## 2015-05-27 DIAGNOSIS — M79601 Pain in right arm: Secondary | ICD-10-CM | POA: Diagnosis not present

## 2015-06-20 DIAGNOSIS — G8929 Other chronic pain: Secondary | ICD-10-CM | POA: Diagnosis not present

## 2015-06-20 DIAGNOSIS — M5441 Lumbago with sciatica, right side: Secondary | ICD-10-CM | POA: Diagnosis not present

## 2015-06-20 DIAGNOSIS — I10 Essential (primary) hypertension: Secondary | ICD-10-CM | POA: Diagnosis not present

## 2015-06-20 DIAGNOSIS — E785 Hyperlipidemia, unspecified: Secondary | ICD-10-CM | POA: Diagnosis not present

## 2015-06-20 DIAGNOSIS — R911 Solitary pulmonary nodule: Secondary | ICD-10-CM | POA: Diagnosis not present

## 2015-06-29 DIAGNOSIS — R05 Cough: Secondary | ICD-10-CM | POA: Diagnosis not present

## 2015-06-29 DIAGNOSIS — R918 Other nonspecific abnormal finding of lung field: Secondary | ICD-10-CM | POA: Diagnosis not present

## 2015-06-29 DIAGNOSIS — R911 Solitary pulmonary nodule: Secondary | ICD-10-CM | POA: Diagnosis not present

## 2015-06-29 DIAGNOSIS — J40 Bronchitis, not specified as acute or chronic: Secondary | ICD-10-CM | POA: Diagnosis not present

## 2015-07-20 DIAGNOSIS — M545 Low back pain: Secondary | ICD-10-CM | POA: Diagnosis not present

## 2015-08-16 DIAGNOSIS — G8929 Other chronic pain: Secondary | ICD-10-CM | POA: Diagnosis not present

## 2015-08-16 DIAGNOSIS — M5416 Radiculopathy, lumbar region: Secondary | ICD-10-CM | POA: Diagnosis not present

## 2015-08-16 DIAGNOSIS — M5442 Lumbago with sciatica, left side: Secondary | ICD-10-CM | POA: Diagnosis not present

## 2015-08-16 DIAGNOSIS — Z87891 Personal history of nicotine dependence: Secondary | ICD-10-CM | POA: Diagnosis not present

## 2015-08-16 DIAGNOSIS — I10 Essential (primary) hypertension: Secondary | ICD-10-CM | POA: Diagnosis not present

## 2015-08-26 DIAGNOSIS — M5416 Radiculopathy, lumbar region: Secondary | ICD-10-CM | POA: Diagnosis not present

## 2015-08-26 DIAGNOSIS — G8929 Other chronic pain: Secondary | ICD-10-CM | POA: Diagnosis not present

## 2015-08-26 DIAGNOSIS — M5126 Other intervertebral disc displacement, lumbar region: Secondary | ICD-10-CM | POA: Diagnosis not present

## 2015-08-26 DIAGNOSIS — M4806 Spinal stenosis, lumbar region: Secondary | ICD-10-CM | POA: Diagnosis not present

## 2015-08-26 DIAGNOSIS — M5442 Lumbago with sciatica, left side: Secondary | ICD-10-CM | POA: Diagnosis not present

## 2015-08-30 DIAGNOSIS — Z87891 Personal history of nicotine dependence: Secondary | ICD-10-CM | POA: Diagnosis not present

## 2015-08-30 DIAGNOSIS — G8929 Other chronic pain: Secondary | ICD-10-CM | POA: Diagnosis not present

## 2015-08-30 DIAGNOSIS — M4806 Spinal stenosis, lumbar region: Secondary | ICD-10-CM | POA: Diagnosis not present

## 2015-08-30 DIAGNOSIS — M1288 Other specific arthropathies, not elsewhere classified, other specified site: Secondary | ICD-10-CM | POA: Diagnosis not present

## 2015-08-30 DIAGNOSIS — M5441 Lumbago with sciatica, right side: Secondary | ICD-10-CM | POA: Diagnosis not present

## 2015-08-30 DIAGNOSIS — M5416 Radiculopathy, lumbar region: Secondary | ICD-10-CM | POA: Diagnosis not present

## 2015-08-30 DIAGNOSIS — M5442 Lumbago with sciatica, left side: Secondary | ICD-10-CM | POA: Diagnosis not present

## 2015-09-02 DIAGNOSIS — M5441 Lumbago with sciatica, right side: Secondary | ICD-10-CM | POA: Diagnosis not present

## 2015-09-02 DIAGNOSIS — I10 Essential (primary) hypertension: Secondary | ICD-10-CM | POA: Diagnosis not present

## 2015-09-02 DIAGNOSIS — G8929 Other chronic pain: Secondary | ICD-10-CM | POA: Diagnosis not present

## 2015-09-02 DIAGNOSIS — Z6829 Body mass index (BMI) 29.0-29.9, adult: Secondary | ICD-10-CM | POA: Diagnosis not present

## 2015-09-02 DIAGNOSIS — Z1389 Encounter for screening for other disorder: Secondary | ICD-10-CM | POA: Diagnosis not present

## 2015-09-02 DIAGNOSIS — I2510 Atherosclerotic heart disease of native coronary artery without angina pectoris: Secondary | ICD-10-CM | POA: Diagnosis not present

## 2015-09-02 DIAGNOSIS — K209 Esophagitis, unspecified: Secondary | ICD-10-CM | POA: Diagnosis not present

## 2015-09-02 DIAGNOSIS — R351 Nocturia: Secondary | ICD-10-CM | POA: Diagnosis not present

## 2015-09-13 DIAGNOSIS — M4806 Spinal stenosis, lumbar region: Secondary | ICD-10-CM | POA: Diagnosis not present

## 2015-09-15 ENCOUNTER — Other Ambulatory Visit: Payer: Self-pay

## 2015-09-30 DIAGNOSIS — M5416 Radiculopathy, lumbar region: Secondary | ICD-10-CM | POA: Diagnosis not present

## 2015-09-30 DIAGNOSIS — M5136 Other intervertebral disc degeneration, lumbar region: Secondary | ICD-10-CM | POA: Diagnosis not present

## 2015-09-30 DIAGNOSIS — M4806 Spinal stenosis, lumbar region: Secondary | ICD-10-CM | POA: Diagnosis not present

## 2015-10-07 DIAGNOSIS — M19012 Primary osteoarthritis, left shoulder: Secondary | ICD-10-CM | POA: Diagnosis not present

## 2015-10-12 DIAGNOSIS — R52 Pain, unspecified: Secondary | ICD-10-CM | POA: Diagnosis not present

## 2015-10-12 DIAGNOSIS — I517 Cardiomegaly: Secondary | ICD-10-CM | POA: Diagnosis not present

## 2015-10-12 DIAGNOSIS — M79609 Pain in unspecified limb: Secondary | ICD-10-CM | POA: Diagnosis not present

## 2015-10-12 DIAGNOSIS — Z01810 Encounter for preprocedural cardiovascular examination: Secondary | ICD-10-CM | POA: Diagnosis not present

## 2015-10-12 DIAGNOSIS — Z79899 Other long term (current) drug therapy: Secondary | ICD-10-CM | POA: Diagnosis not present

## 2015-10-12 DIAGNOSIS — Z01818 Encounter for other preprocedural examination: Secondary | ICD-10-CM | POA: Diagnosis not present

## 2015-10-12 DIAGNOSIS — E559 Vitamin D deficiency, unspecified: Secondary | ICD-10-CM | POA: Diagnosis not present

## 2015-10-14 DIAGNOSIS — Z6829 Body mass index (BMI) 29.0-29.9, adult: Secondary | ICD-10-CM | POA: Diagnosis not present

## 2015-10-14 DIAGNOSIS — I2510 Atherosclerotic heart disease of native coronary artery without angina pectoris: Secondary | ICD-10-CM | POA: Diagnosis not present

## 2015-10-14 DIAGNOSIS — M25512 Pain in left shoulder: Secondary | ICD-10-CM | POA: Diagnosis not present

## 2015-10-14 DIAGNOSIS — I447 Left bundle-branch block, unspecified: Secondary | ICD-10-CM | POA: Diagnosis not present

## 2015-10-14 DIAGNOSIS — M549 Dorsalgia, unspecified: Secondary | ICD-10-CM | POA: Diagnosis not present

## 2015-10-15 DIAGNOSIS — M25412 Effusion, left shoulder: Secondary | ICD-10-CM | POA: Diagnosis not present

## 2015-10-15 DIAGNOSIS — M25512 Pain in left shoulder: Secondary | ICD-10-CM | POA: Diagnosis not present

## 2015-10-21 DIAGNOSIS — M48061 Spinal stenosis, lumbar region without neurogenic claudication: Secondary | ICD-10-CM | POA: Diagnosis not present

## 2015-10-21 DIAGNOSIS — M5416 Radiculopathy, lumbar region: Secondary | ICD-10-CM | POA: Diagnosis not present

## 2015-10-21 DIAGNOSIS — M5136 Other intervertebral disc degeneration, lumbar region: Secondary | ICD-10-CM | POA: Diagnosis not present

## 2015-10-25 DIAGNOSIS — R9431 Abnormal electrocardiogram [ECG] [EKG]: Secondary | ICD-10-CM | POA: Diagnosis not present

## 2015-10-25 DIAGNOSIS — Z01818 Encounter for other preprocedural examination: Secondary | ICD-10-CM | POA: Diagnosis not present

## 2015-10-28 DIAGNOSIS — Z8679 Personal history of other diseases of the circulatory system: Secondary | ICD-10-CM

## 2015-10-28 DIAGNOSIS — I209 Angina pectoris, unspecified: Secondary | ICD-10-CM | POA: Diagnosis not present

## 2015-10-28 DIAGNOSIS — Z6829 Body mass index (BMI) 29.0-29.9, adult: Secondary | ICD-10-CM | POA: Diagnosis not present

## 2015-10-28 DIAGNOSIS — I429 Cardiomyopathy, unspecified: Secondary | ICD-10-CM | POA: Diagnosis not present

## 2015-10-28 DIAGNOSIS — I2510 Atherosclerotic heart disease of native coronary artery without angina pectoris: Secondary | ICD-10-CM | POA: Diagnosis not present

## 2015-10-28 DIAGNOSIS — E785 Hyperlipidemia, unspecified: Secondary | ICD-10-CM | POA: Insufficient documentation

## 2015-10-28 DIAGNOSIS — I10 Essential (primary) hypertension: Secondary | ICD-10-CM | POA: Diagnosis not present

## 2015-10-28 DIAGNOSIS — Z9889 Other specified postprocedural states: Secondary | ICD-10-CM

## 2015-10-28 HISTORY — DX: Hyperlipidemia, unspecified: E78.5

## 2015-10-28 HISTORY — DX: Personal history of other diseases of the circulatory system: Z86.79

## 2015-11-04 DIAGNOSIS — I25119 Atherosclerotic heart disease of native coronary artery with unspecified angina pectoris: Secondary | ICD-10-CM | POA: Diagnosis not present

## 2015-11-04 DIAGNOSIS — I10 Essential (primary) hypertension: Secondary | ICD-10-CM | POA: Diagnosis not present

## 2015-11-04 DIAGNOSIS — I429 Cardiomyopathy, unspecified: Secondary | ICD-10-CM | POA: Diagnosis not present

## 2015-11-04 DIAGNOSIS — Z87891 Personal history of nicotine dependence: Secondary | ICD-10-CM | POA: Diagnosis not present

## 2015-11-04 DIAGNOSIS — E785 Hyperlipidemia, unspecified: Secondary | ICD-10-CM | POA: Diagnosis not present

## 2015-11-04 DIAGNOSIS — R0789 Other chest pain: Secondary | ICD-10-CM | POA: Diagnosis not present

## 2015-11-04 DIAGNOSIS — Z79899 Other long term (current) drug therapy: Secondary | ICD-10-CM | POA: Diagnosis not present

## 2015-11-04 DIAGNOSIS — Z9889 Other specified postprocedural states: Secondary | ICD-10-CM | POA: Diagnosis not present

## 2015-11-04 DIAGNOSIS — I501 Left ventricular failure: Secondary | ICD-10-CM | POA: Diagnosis not present

## 2015-11-04 DIAGNOSIS — I25118 Atherosclerotic heart disease of native coronary artery with other forms of angina pectoris: Secondary | ICD-10-CM | POA: Diagnosis not present

## 2015-12-05 DIAGNOSIS — Z6828 Body mass index (BMI) 28.0-28.9, adult: Secondary | ICD-10-CM | POA: Diagnosis not present

## 2015-12-05 DIAGNOSIS — I2510 Atherosclerotic heart disease of native coronary artery without angina pectoris: Secondary | ICD-10-CM | POA: Diagnosis not present

## 2015-12-05 DIAGNOSIS — E785 Hyperlipidemia, unspecified: Secondary | ICD-10-CM | POA: Diagnosis not present

## 2015-12-05 DIAGNOSIS — Z9889 Other specified postprocedural states: Secondary | ICD-10-CM | POA: Diagnosis not present

## 2015-12-05 DIAGNOSIS — Z8679 Personal history of other diseases of the circulatory system: Secondary | ICD-10-CM | POA: Diagnosis not present

## 2015-12-05 DIAGNOSIS — I429 Cardiomyopathy, unspecified: Secondary | ICD-10-CM | POA: Diagnosis not present

## 2015-12-05 DIAGNOSIS — I10 Essential (primary) hypertension: Secondary | ICD-10-CM | POA: Diagnosis not present

## 2015-12-05 DIAGNOSIS — I209 Angina pectoris, unspecified: Secondary | ICD-10-CM | POA: Diagnosis not present

## 2016-01-31 DIAGNOSIS — E785 Hyperlipidemia, unspecified: Secondary | ICD-10-CM | POA: Diagnosis not present

## 2016-01-31 DIAGNOSIS — I209 Angina pectoris, unspecified: Secondary | ICD-10-CM | POA: Diagnosis not present

## 2016-01-31 DIAGNOSIS — I10 Essential (primary) hypertension: Secondary | ICD-10-CM | POA: Diagnosis not present

## 2016-01-31 DIAGNOSIS — I2510 Atherosclerotic heart disease of native coronary artery without angina pectoris: Secondary | ICD-10-CM | POA: Diagnosis not present

## 2016-01-31 DIAGNOSIS — I429 Cardiomyopathy, unspecified: Secondary | ICD-10-CM | POA: Diagnosis not present

## 2016-01-31 DIAGNOSIS — Z8679 Personal history of other diseases of the circulatory system: Secondary | ICD-10-CM | POA: Diagnosis not present

## 2016-01-31 DIAGNOSIS — Z9889 Other specified postprocedural states: Secondary | ICD-10-CM | POA: Diagnosis not present

## 2016-02-06 DIAGNOSIS — Z6828 Body mass index (BMI) 28.0-28.9, adult: Secondary | ICD-10-CM | POA: Diagnosis not present

## 2016-02-06 DIAGNOSIS — I10 Essential (primary) hypertension: Secondary | ICD-10-CM | POA: Diagnosis not present

## 2016-02-06 DIAGNOSIS — E785 Hyperlipidemia, unspecified: Secondary | ICD-10-CM | POA: Diagnosis not present

## 2016-02-06 DIAGNOSIS — I2510 Atherosclerotic heart disease of native coronary artery without angina pectoris: Secondary | ICD-10-CM | POA: Diagnosis not present

## 2016-02-06 DIAGNOSIS — Z8679 Personal history of other diseases of the circulatory system: Secondary | ICD-10-CM | POA: Diagnosis not present

## 2016-02-06 DIAGNOSIS — Z9889 Other specified postprocedural states: Secondary | ICD-10-CM | POA: Diagnosis not present

## 2016-03-14 DIAGNOSIS — E785 Hyperlipidemia, unspecified: Secondary | ICD-10-CM | POA: Diagnosis not present

## 2016-03-14 DIAGNOSIS — I2510 Atherosclerotic heart disease of native coronary artery without angina pectoris: Secondary | ICD-10-CM | POA: Diagnosis not present

## 2016-03-14 DIAGNOSIS — I10 Essential (primary) hypertension: Secondary | ICD-10-CM | POA: Diagnosis not present

## 2016-04-04 DIAGNOSIS — I429 Cardiomyopathy, unspecified: Secondary | ICD-10-CM | POA: Diagnosis not present

## 2016-04-04 DIAGNOSIS — I10 Essential (primary) hypertension: Secondary | ICD-10-CM | POA: Diagnosis not present

## 2016-04-04 DIAGNOSIS — J309 Allergic rhinitis, unspecified: Secondary | ICD-10-CM | POA: Diagnosis not present

## 2016-04-04 DIAGNOSIS — K2270 Barrett's esophagus without dysplasia: Secondary | ICD-10-CM | POA: Diagnosis not present

## 2016-04-18 DIAGNOSIS — M48062 Spinal stenosis, lumbar region with neurogenic claudication: Secondary | ICD-10-CM | POA: Diagnosis not present

## 2016-04-19 DIAGNOSIS — M48062 Spinal stenosis, lumbar region with neurogenic claudication: Secondary | ICD-10-CM | POA: Diagnosis not present

## 2016-04-19 DIAGNOSIS — M48061 Spinal stenosis, lumbar region without neurogenic claudication: Secondary | ICD-10-CM | POA: Diagnosis not present

## 2016-04-24 DIAGNOSIS — M48062 Spinal stenosis, lumbar region with neurogenic claudication: Secondary | ICD-10-CM | POA: Diagnosis not present

## 2016-05-07 DIAGNOSIS — K22719 Barrett's esophagus with dysplasia, unspecified: Secondary | ICD-10-CM | POA: Diagnosis not present

## 2016-05-07 DIAGNOSIS — M47817 Spondylosis without myelopathy or radiculopathy, lumbosacral region: Secondary | ICD-10-CM | POA: Diagnosis not present

## 2016-05-07 DIAGNOSIS — I10 Essential (primary) hypertension: Secondary | ICD-10-CM | POA: Diagnosis not present

## 2016-05-07 DIAGNOSIS — M48062 Spinal stenosis, lumbar region with neurogenic claudication: Secondary | ICD-10-CM | POA: Diagnosis not present

## 2016-05-07 DIAGNOSIS — J309 Allergic rhinitis, unspecified: Secondary | ICD-10-CM | POA: Diagnosis not present

## 2016-05-07 DIAGNOSIS — I255 Ischemic cardiomyopathy: Secondary | ICD-10-CM | POA: Diagnosis not present

## 2016-05-28 DIAGNOSIS — J3089 Other allergic rhinitis: Secondary | ICD-10-CM | POA: Diagnosis not present

## 2016-06-13 DIAGNOSIS — I10 Essential (primary) hypertension: Secondary | ICD-10-CM | POA: Diagnosis not present

## 2016-06-13 DIAGNOSIS — J309 Allergic rhinitis, unspecified: Secondary | ICD-10-CM | POA: Diagnosis not present

## 2016-06-13 DIAGNOSIS — K22719 Barrett's esophagus with dysplasia, unspecified: Secondary | ICD-10-CM | POA: Diagnosis not present

## 2016-07-31 DIAGNOSIS — M545 Low back pain: Secondary | ICD-10-CM | POA: Diagnosis not present

## 2016-07-31 DIAGNOSIS — M9901 Segmental and somatic dysfunction of cervical region: Secondary | ICD-10-CM | POA: Diagnosis not present

## 2016-07-31 DIAGNOSIS — M9902 Segmental and somatic dysfunction of thoracic region: Secondary | ICD-10-CM | POA: Diagnosis not present

## 2016-07-31 DIAGNOSIS — M9903 Segmental and somatic dysfunction of lumbar region: Secondary | ICD-10-CM | POA: Diagnosis not present

## 2016-07-31 DIAGNOSIS — M50322 Other cervical disc degeneration at C5-C6 level: Secondary | ICD-10-CM | POA: Diagnosis not present

## 2016-07-31 DIAGNOSIS — M5383 Other specified dorsopathies, cervicothoracic region: Secondary | ICD-10-CM | POA: Diagnosis not present

## 2016-07-31 DIAGNOSIS — M542 Cervicalgia: Secondary | ICD-10-CM | POA: Diagnosis not present

## 2016-08-02 DIAGNOSIS — M5383 Other specified dorsopathies, cervicothoracic region: Secondary | ICD-10-CM | POA: Diagnosis not present

## 2016-08-02 DIAGNOSIS — M545 Low back pain: Secondary | ICD-10-CM | POA: Diagnosis not present

## 2016-08-02 DIAGNOSIS — M50322 Other cervical disc degeneration at C5-C6 level: Secondary | ICD-10-CM | POA: Diagnosis not present

## 2016-08-02 DIAGNOSIS — M542 Cervicalgia: Secondary | ICD-10-CM | POA: Diagnosis not present

## 2016-08-02 DIAGNOSIS — M9901 Segmental and somatic dysfunction of cervical region: Secondary | ICD-10-CM | POA: Diagnosis not present

## 2016-08-02 DIAGNOSIS — M9902 Segmental and somatic dysfunction of thoracic region: Secondary | ICD-10-CM | POA: Diagnosis not present

## 2016-08-02 DIAGNOSIS — M9903 Segmental and somatic dysfunction of lumbar region: Secondary | ICD-10-CM | POA: Diagnosis not present

## 2016-08-06 DIAGNOSIS — M9902 Segmental and somatic dysfunction of thoracic region: Secondary | ICD-10-CM | POA: Diagnosis not present

## 2016-08-06 DIAGNOSIS — M50322 Other cervical disc degeneration at C5-C6 level: Secondary | ICD-10-CM | POA: Diagnosis not present

## 2016-08-06 DIAGNOSIS — M5383 Other specified dorsopathies, cervicothoracic region: Secondary | ICD-10-CM | POA: Diagnosis not present

## 2016-08-06 DIAGNOSIS — M542 Cervicalgia: Secondary | ICD-10-CM | POA: Diagnosis not present

## 2016-08-06 DIAGNOSIS — M9901 Segmental and somatic dysfunction of cervical region: Secondary | ICD-10-CM | POA: Diagnosis not present

## 2016-08-06 DIAGNOSIS — M9903 Segmental and somatic dysfunction of lumbar region: Secondary | ICD-10-CM | POA: Diagnosis not present

## 2016-08-06 DIAGNOSIS — M545 Low back pain: Secondary | ICD-10-CM | POA: Diagnosis not present

## 2016-08-08 DIAGNOSIS — M542 Cervicalgia: Secondary | ICD-10-CM | POA: Diagnosis not present

## 2016-08-08 DIAGNOSIS — M9901 Segmental and somatic dysfunction of cervical region: Secondary | ICD-10-CM | POA: Diagnosis not present

## 2016-08-08 DIAGNOSIS — M545 Low back pain: Secondary | ICD-10-CM | POA: Diagnosis not present

## 2016-08-08 DIAGNOSIS — M9902 Segmental and somatic dysfunction of thoracic region: Secondary | ICD-10-CM | POA: Diagnosis not present

## 2016-08-08 DIAGNOSIS — M5383 Other specified dorsopathies, cervicothoracic region: Secondary | ICD-10-CM | POA: Diagnosis not present

## 2016-08-08 DIAGNOSIS — M9903 Segmental and somatic dysfunction of lumbar region: Secondary | ICD-10-CM | POA: Diagnosis not present

## 2016-08-08 DIAGNOSIS — M50322 Other cervical disc degeneration at C5-C6 level: Secondary | ICD-10-CM | POA: Diagnosis not present

## 2016-08-09 DIAGNOSIS — M545 Low back pain: Secondary | ICD-10-CM | POA: Diagnosis not present

## 2016-08-09 DIAGNOSIS — M9903 Segmental and somatic dysfunction of lumbar region: Secondary | ICD-10-CM | POA: Diagnosis not present

## 2016-08-09 DIAGNOSIS — M542 Cervicalgia: Secondary | ICD-10-CM | POA: Diagnosis not present

## 2016-08-09 DIAGNOSIS — M9901 Segmental and somatic dysfunction of cervical region: Secondary | ICD-10-CM | POA: Diagnosis not present

## 2016-08-09 DIAGNOSIS — M9902 Segmental and somatic dysfunction of thoracic region: Secondary | ICD-10-CM | POA: Diagnosis not present

## 2016-08-09 DIAGNOSIS — M5383 Other specified dorsopathies, cervicothoracic region: Secondary | ICD-10-CM | POA: Diagnosis not present

## 2016-08-09 DIAGNOSIS — M50322 Other cervical disc degeneration at C5-C6 level: Secondary | ICD-10-CM | POA: Diagnosis not present

## 2016-08-13 DIAGNOSIS — M9902 Segmental and somatic dysfunction of thoracic region: Secondary | ICD-10-CM | POA: Diagnosis not present

## 2016-08-13 DIAGNOSIS — M545 Low back pain: Secondary | ICD-10-CM | POA: Diagnosis not present

## 2016-08-13 DIAGNOSIS — M5383 Other specified dorsopathies, cervicothoracic region: Secondary | ICD-10-CM | POA: Diagnosis not present

## 2016-08-13 DIAGNOSIS — M50322 Other cervical disc degeneration at C5-C6 level: Secondary | ICD-10-CM | POA: Diagnosis not present

## 2016-08-13 DIAGNOSIS — M542 Cervicalgia: Secondary | ICD-10-CM | POA: Diagnosis not present

## 2016-08-13 DIAGNOSIS — M9901 Segmental and somatic dysfunction of cervical region: Secondary | ICD-10-CM | POA: Diagnosis not present

## 2016-08-13 DIAGNOSIS — M9903 Segmental and somatic dysfunction of lumbar region: Secondary | ICD-10-CM | POA: Diagnosis not present

## 2016-08-14 DIAGNOSIS — M9902 Segmental and somatic dysfunction of thoracic region: Secondary | ICD-10-CM | POA: Diagnosis not present

## 2016-08-14 DIAGNOSIS — M50322 Other cervical disc degeneration at C5-C6 level: Secondary | ICD-10-CM | POA: Diagnosis not present

## 2016-08-14 DIAGNOSIS — M9901 Segmental and somatic dysfunction of cervical region: Secondary | ICD-10-CM | POA: Diagnosis not present

## 2016-08-14 DIAGNOSIS — M542 Cervicalgia: Secondary | ICD-10-CM | POA: Diagnosis not present

## 2016-08-14 DIAGNOSIS — M9903 Segmental and somatic dysfunction of lumbar region: Secondary | ICD-10-CM | POA: Diagnosis not present

## 2016-08-14 DIAGNOSIS — M545 Low back pain: Secondary | ICD-10-CM | POA: Diagnosis not present

## 2016-08-14 DIAGNOSIS — M5383 Other specified dorsopathies, cervicothoracic region: Secondary | ICD-10-CM | POA: Diagnosis not present

## 2016-08-15 DIAGNOSIS — M545 Low back pain: Secondary | ICD-10-CM | POA: Diagnosis not present

## 2016-08-15 DIAGNOSIS — M50322 Other cervical disc degeneration at C5-C6 level: Secondary | ICD-10-CM | POA: Diagnosis not present

## 2016-08-15 DIAGNOSIS — M9901 Segmental and somatic dysfunction of cervical region: Secondary | ICD-10-CM | POA: Diagnosis not present

## 2016-08-15 DIAGNOSIS — M5383 Other specified dorsopathies, cervicothoracic region: Secondary | ICD-10-CM | POA: Diagnosis not present

## 2016-08-15 DIAGNOSIS — M9903 Segmental and somatic dysfunction of lumbar region: Secondary | ICD-10-CM | POA: Diagnosis not present

## 2016-08-15 DIAGNOSIS — M542 Cervicalgia: Secondary | ICD-10-CM | POA: Diagnosis not present

## 2016-08-15 DIAGNOSIS — M9902 Segmental and somatic dysfunction of thoracic region: Secondary | ICD-10-CM | POA: Diagnosis not present

## 2016-08-16 DIAGNOSIS — M47817 Spondylosis without myelopathy or radiculopathy, lumbosacral region: Secondary | ICD-10-CM | POA: Diagnosis not present

## 2016-08-16 DIAGNOSIS — M48062 Spinal stenosis, lumbar region with neurogenic claudication: Secondary | ICD-10-CM | POA: Diagnosis not present

## 2016-08-20 DIAGNOSIS — M50322 Other cervical disc degeneration at C5-C6 level: Secondary | ICD-10-CM | POA: Diagnosis not present

## 2016-08-20 DIAGNOSIS — M5383 Other specified dorsopathies, cervicothoracic region: Secondary | ICD-10-CM | POA: Diagnosis not present

## 2016-08-20 DIAGNOSIS — M9902 Segmental and somatic dysfunction of thoracic region: Secondary | ICD-10-CM | POA: Diagnosis not present

## 2016-08-20 DIAGNOSIS — M542 Cervicalgia: Secondary | ICD-10-CM | POA: Diagnosis not present

## 2016-08-20 DIAGNOSIS — M9903 Segmental and somatic dysfunction of lumbar region: Secondary | ICD-10-CM | POA: Diagnosis not present

## 2016-08-20 DIAGNOSIS — M545 Low back pain: Secondary | ICD-10-CM | POA: Diagnosis not present

## 2016-08-20 DIAGNOSIS — M9901 Segmental and somatic dysfunction of cervical region: Secondary | ICD-10-CM | POA: Diagnosis not present

## 2016-08-22 DIAGNOSIS — M542 Cervicalgia: Secondary | ICD-10-CM | POA: Diagnosis not present

## 2016-08-22 DIAGNOSIS — M5383 Other specified dorsopathies, cervicothoracic region: Secondary | ICD-10-CM | POA: Diagnosis not present

## 2016-08-22 DIAGNOSIS — M545 Low back pain: Secondary | ICD-10-CM | POA: Diagnosis not present

## 2016-08-22 DIAGNOSIS — M9901 Segmental and somatic dysfunction of cervical region: Secondary | ICD-10-CM | POA: Diagnosis not present

## 2016-08-22 DIAGNOSIS — M50322 Other cervical disc degeneration at C5-C6 level: Secondary | ICD-10-CM | POA: Diagnosis not present

## 2016-08-22 DIAGNOSIS — M9902 Segmental and somatic dysfunction of thoracic region: Secondary | ICD-10-CM | POA: Diagnosis not present

## 2016-08-22 DIAGNOSIS — M9903 Segmental and somatic dysfunction of lumbar region: Secondary | ICD-10-CM | POA: Diagnosis not present

## 2016-09-05 DIAGNOSIS — M5383 Other specified dorsopathies, cervicothoracic region: Secondary | ICD-10-CM | POA: Diagnosis not present

## 2016-09-05 DIAGNOSIS — M542 Cervicalgia: Secondary | ICD-10-CM | POA: Diagnosis not present

## 2016-09-05 DIAGNOSIS — M9901 Segmental and somatic dysfunction of cervical region: Secondary | ICD-10-CM | POA: Diagnosis not present

## 2016-09-05 DIAGNOSIS — M9903 Segmental and somatic dysfunction of lumbar region: Secondary | ICD-10-CM | POA: Diagnosis not present

## 2016-09-05 DIAGNOSIS — M9902 Segmental and somatic dysfunction of thoracic region: Secondary | ICD-10-CM | POA: Diagnosis not present

## 2016-09-05 DIAGNOSIS — M50322 Other cervical disc degeneration at C5-C6 level: Secondary | ICD-10-CM | POA: Diagnosis not present

## 2016-09-05 DIAGNOSIS — M545 Low back pain: Secondary | ICD-10-CM | POA: Diagnosis not present

## 2016-09-14 DIAGNOSIS — J309 Allergic rhinitis, unspecified: Secondary | ICD-10-CM | POA: Diagnosis not present

## 2016-09-14 DIAGNOSIS — K22719 Barrett's esophagus with dysplasia, unspecified: Secondary | ICD-10-CM | POA: Diagnosis not present

## 2016-09-14 DIAGNOSIS — I255 Ischemic cardiomyopathy: Secondary | ICD-10-CM | POA: Diagnosis not present

## 2016-09-14 DIAGNOSIS — I10 Essential (primary) hypertension: Secondary | ICD-10-CM | POA: Diagnosis not present

## 2016-09-19 DIAGNOSIS — M9903 Segmental and somatic dysfunction of lumbar region: Secondary | ICD-10-CM | POA: Diagnosis not present

## 2016-09-19 DIAGNOSIS — M9901 Segmental and somatic dysfunction of cervical region: Secondary | ICD-10-CM | POA: Diagnosis not present

## 2016-09-19 DIAGNOSIS — M5383 Other specified dorsopathies, cervicothoracic region: Secondary | ICD-10-CM | POA: Diagnosis not present

## 2016-09-19 DIAGNOSIS — M50322 Other cervical disc degeneration at C5-C6 level: Secondary | ICD-10-CM | POA: Diagnosis not present

## 2016-09-19 DIAGNOSIS — M545 Low back pain: Secondary | ICD-10-CM | POA: Diagnosis not present

## 2016-09-19 DIAGNOSIS — M9902 Segmental and somatic dysfunction of thoracic region: Secondary | ICD-10-CM | POA: Diagnosis not present

## 2016-09-19 DIAGNOSIS — M542 Cervicalgia: Secondary | ICD-10-CM | POA: Diagnosis not present

## 2016-11-16 DIAGNOSIS — K210 Gastro-esophageal reflux disease with esophagitis: Secondary | ICD-10-CM | POA: Diagnosis not present

## 2016-11-16 DIAGNOSIS — K2270 Barrett's esophagus without dysplasia: Secondary | ICD-10-CM | POA: Diagnosis not present

## 2016-11-19 DIAGNOSIS — E785 Hyperlipidemia, unspecified: Secondary | ICD-10-CM | POA: Diagnosis not present

## 2016-11-19 DIAGNOSIS — Z955 Presence of coronary angioplasty implant and graft: Secondary | ICD-10-CM | POA: Diagnosis not present

## 2016-11-19 DIAGNOSIS — I252 Old myocardial infarction: Secondary | ICD-10-CM | POA: Diagnosis not present

## 2016-11-19 DIAGNOSIS — Z7982 Long term (current) use of aspirin: Secondary | ICD-10-CM | POA: Diagnosis not present

## 2016-11-19 DIAGNOSIS — I2510 Atherosclerotic heart disease of native coronary artery without angina pectoris: Secondary | ICD-10-CM | POA: Diagnosis not present

## 2016-11-19 DIAGNOSIS — Z87891 Personal history of nicotine dependence: Secondary | ICD-10-CM | POA: Diagnosis not present

## 2016-11-19 DIAGNOSIS — K219 Gastro-esophageal reflux disease without esophagitis: Secondary | ICD-10-CM | POA: Diagnosis not present

## 2016-11-19 DIAGNOSIS — I10 Essential (primary) hypertension: Secondary | ICD-10-CM | POA: Diagnosis not present

## 2016-11-19 DIAGNOSIS — Z79899 Other long term (current) drug therapy: Secondary | ICD-10-CM | POA: Diagnosis not present

## 2016-11-19 DIAGNOSIS — K210 Gastro-esophageal reflux disease with esophagitis: Secondary | ICD-10-CM | POA: Diagnosis not present

## 2016-11-19 DIAGNOSIS — K2270 Barrett's esophagus without dysplasia: Secondary | ICD-10-CM | POA: Diagnosis not present

## 2016-11-19 DIAGNOSIS — K449 Diaphragmatic hernia without obstruction or gangrene: Secondary | ICD-10-CM | POA: Diagnosis not present

## 2016-12-14 DIAGNOSIS — Z1212 Encounter for screening for malignant neoplasm of rectum: Secondary | ICD-10-CM | POA: Diagnosis not present

## 2016-12-14 DIAGNOSIS — Z23 Encounter for immunization: Secondary | ICD-10-CM | POA: Diagnosis not present

## 2016-12-14 DIAGNOSIS — I10 Essential (primary) hypertension: Secondary | ICD-10-CM | POA: Diagnosis not present

## 2016-12-14 DIAGNOSIS — Z125 Encounter for screening for malignant neoplasm of prostate: Secondary | ICD-10-CM | POA: Diagnosis not present

## 2016-12-14 DIAGNOSIS — M549 Dorsalgia, unspecified: Secondary | ICD-10-CM | POA: Diagnosis not present

## 2016-12-14 DIAGNOSIS — I2510 Atherosclerotic heart disease of native coronary artery without angina pectoris: Secondary | ICD-10-CM | POA: Diagnosis not present

## 2016-12-14 DIAGNOSIS — Z1389 Encounter for screening for other disorder: Secondary | ICD-10-CM | POA: Diagnosis not present

## 2016-12-14 DIAGNOSIS — I429 Cardiomyopathy, unspecified: Secondary | ICD-10-CM | POA: Diagnosis not present

## 2016-12-14 DIAGNOSIS — Z6829 Body mass index (BMI) 29.0-29.9, adult: Secondary | ICD-10-CM | POA: Diagnosis not present

## 2017-01-24 DIAGNOSIS — M48062 Spinal stenosis, lumbar region with neurogenic claudication: Secondary | ICD-10-CM | POA: Diagnosis not present

## 2017-01-24 DIAGNOSIS — M545 Low back pain: Secondary | ICD-10-CM | POA: Diagnosis not present

## 2017-01-31 DIAGNOSIS — L820 Inflamed seborrheic keratosis: Secondary | ICD-10-CM | POA: Diagnosis not present

## 2017-01-31 DIAGNOSIS — D1801 Hemangioma of skin and subcutaneous tissue: Secondary | ICD-10-CM | POA: Diagnosis not present

## 2017-01-31 DIAGNOSIS — L578 Other skin changes due to chronic exposure to nonionizing radiation: Secondary | ICD-10-CM | POA: Diagnosis not present

## 2017-01-31 DIAGNOSIS — L814 Other melanin hyperpigmentation: Secondary | ICD-10-CM | POA: Diagnosis not present

## 2017-03-15 DIAGNOSIS — I10 Essential (primary) hypertension: Secondary | ICD-10-CM | POA: Diagnosis not present

## 2017-03-15 DIAGNOSIS — R079 Chest pain, unspecified: Secondary | ICD-10-CM | POA: Diagnosis not present

## 2017-03-15 DIAGNOSIS — I2510 Atherosclerotic heart disease of native coronary artery without angina pectoris: Secondary | ICD-10-CM | POA: Diagnosis not present

## 2017-03-15 DIAGNOSIS — E785 Hyperlipidemia, unspecified: Secondary | ICD-10-CM | POA: Diagnosis not present

## 2017-03-15 DIAGNOSIS — K22719 Barrett's esophagus with dysplasia, unspecified: Secondary | ICD-10-CM | POA: Diagnosis not present

## 2017-03-15 DIAGNOSIS — I255 Ischemic cardiomyopathy: Secondary | ICD-10-CM | POA: Diagnosis not present

## 2017-04-22 DIAGNOSIS — K22719 Barrett's esophagus with dysplasia, unspecified: Secondary | ICD-10-CM | POA: Diagnosis not present

## 2017-04-22 DIAGNOSIS — I10 Essential (primary) hypertension: Secondary | ICD-10-CM | POA: Diagnosis not present

## 2017-04-22 DIAGNOSIS — I2510 Atherosclerotic heart disease of native coronary artery without angina pectoris: Secondary | ICD-10-CM | POA: Diagnosis not present

## 2017-04-22 DIAGNOSIS — R079 Chest pain, unspecified: Secondary | ICD-10-CM | POA: Diagnosis not present

## 2017-04-22 DIAGNOSIS — I255 Ischemic cardiomyopathy: Secondary | ICD-10-CM | POA: Diagnosis not present

## 2017-05-02 DIAGNOSIS — R0600 Dyspnea, unspecified: Secondary | ICD-10-CM | POA: Diagnosis not present

## 2017-05-02 DIAGNOSIS — R079 Chest pain, unspecified: Secondary | ICD-10-CM | POA: Diagnosis not present

## 2017-06-25 DIAGNOSIS — M549 Dorsalgia, unspecified: Secondary | ICD-10-CM | POA: Diagnosis not present

## 2017-06-25 DIAGNOSIS — Z79899 Other long term (current) drug therapy: Secondary | ICD-10-CM | POA: Diagnosis not present

## 2017-06-25 DIAGNOSIS — M47817 Spondylosis without myelopathy or radiculopathy, lumbosacral region: Secondary | ICD-10-CM | POA: Diagnosis not present

## 2017-06-25 DIAGNOSIS — M48062 Spinal stenosis, lumbar region with neurogenic claudication: Secondary | ICD-10-CM | POA: Diagnosis not present

## 2017-06-25 DIAGNOSIS — M25512 Pain in left shoulder: Secondary | ICD-10-CM | POA: Diagnosis not present

## 2017-06-25 DIAGNOSIS — H9190 Unspecified hearing loss, unspecified ear: Secondary | ICD-10-CM | POA: Diagnosis not present

## 2017-06-25 DIAGNOSIS — E785 Hyperlipidemia, unspecified: Secondary | ICD-10-CM | POA: Diagnosis not present

## 2017-06-25 DIAGNOSIS — I252 Old myocardial infarction: Secondary | ICD-10-CM | POA: Diagnosis not present

## 2017-06-25 DIAGNOSIS — I2510 Atherosclerotic heart disease of native coronary artery without angina pectoris: Secondary | ICD-10-CM | POA: Diagnosis not present

## 2017-06-25 DIAGNOSIS — I10 Essential (primary) hypertension: Secondary | ICD-10-CM | POA: Diagnosis not present

## 2017-07-09 DIAGNOSIS — M47817 Spondylosis without myelopathy or radiculopathy, lumbosacral region: Secondary | ICD-10-CM | POA: Diagnosis not present

## 2017-07-09 DIAGNOSIS — M48062 Spinal stenosis, lumbar region with neurogenic claudication: Secondary | ICD-10-CM | POA: Diagnosis not present

## 2017-07-12 DIAGNOSIS — R252 Cramp and spasm: Secondary | ICD-10-CM | POA: Diagnosis not present

## 2017-07-12 DIAGNOSIS — M545 Low back pain: Secondary | ICD-10-CM | POA: Diagnosis not present

## 2017-07-16 DIAGNOSIS — M48062 Spinal stenosis, lumbar region with neurogenic claudication: Secondary | ICD-10-CM | POA: Diagnosis not present

## 2017-07-23 DIAGNOSIS — M48062 Spinal stenosis, lumbar region with neurogenic claudication: Secondary | ICD-10-CM | POA: Diagnosis not present

## 2017-07-23 DIAGNOSIS — M545 Low back pain: Secondary | ICD-10-CM | POA: Diagnosis not present

## 2017-07-25 DIAGNOSIS — M48062 Spinal stenosis, lumbar region with neurogenic claudication: Secondary | ICD-10-CM | POA: Diagnosis not present

## 2017-08-15 DIAGNOSIS — M48062 Spinal stenosis, lumbar region with neurogenic claudication: Secondary | ICD-10-CM | POA: Diagnosis not present

## 2017-08-15 DIAGNOSIS — M48061 Spinal stenosis, lumbar region without neurogenic claudication: Secondary | ICD-10-CM | POA: Diagnosis not present

## 2017-08-28 DIAGNOSIS — Z79899 Other long term (current) drug therapy: Secondary | ICD-10-CM | POA: Diagnosis not present

## 2017-08-28 DIAGNOSIS — R52 Pain, unspecified: Secondary | ICD-10-CM | POA: Diagnosis not present

## 2017-08-28 DIAGNOSIS — M48062 Spinal stenosis, lumbar region with neurogenic claudication: Secondary | ICD-10-CM | POA: Diagnosis not present

## 2017-08-28 DIAGNOSIS — Z01818 Encounter for other preprocedural examination: Secondary | ICD-10-CM | POA: Diagnosis not present

## 2017-08-28 DIAGNOSIS — M79609 Pain in unspecified limb: Secondary | ICD-10-CM | POA: Diagnosis not present

## 2017-08-28 DIAGNOSIS — J984 Other disorders of lung: Secondary | ICD-10-CM | POA: Diagnosis not present

## 2017-08-28 DIAGNOSIS — E559 Vitamin D deficiency, unspecified: Secondary | ICD-10-CM | POA: Diagnosis not present

## 2017-08-29 DIAGNOSIS — I2510 Atherosclerotic heart disease of native coronary artery without angina pectoris: Secondary | ICD-10-CM | POA: Diagnosis not present

## 2017-08-29 DIAGNOSIS — M5442 Lumbago with sciatica, left side: Secondary | ICD-10-CM | POA: Diagnosis not present

## 2017-08-29 DIAGNOSIS — G8929 Other chronic pain: Secondary | ICD-10-CM | POA: Diagnosis not present

## 2017-08-29 DIAGNOSIS — I428 Other cardiomyopathies: Secondary | ICD-10-CM | POA: Diagnosis not present

## 2017-10-14 DIAGNOSIS — M545 Low back pain: Secondary | ICD-10-CM | POA: Diagnosis not present

## 2017-10-14 DIAGNOSIS — M48062 Spinal stenosis, lumbar region with neurogenic claudication: Secondary | ICD-10-CM | POA: Diagnosis not present

## 2017-10-14 DIAGNOSIS — M47817 Spondylosis without myelopathy or radiculopathy, lumbosacral region: Secondary | ICD-10-CM | POA: Diagnosis not present

## 2017-11-20 DIAGNOSIS — M48062 Spinal stenosis, lumbar region with neurogenic claudication: Secondary | ICD-10-CM | POA: Diagnosis not present

## 2017-11-25 DIAGNOSIS — Z01818 Encounter for other preprocedural examination: Secondary | ICD-10-CM | POA: Diagnosis not present

## 2017-11-25 DIAGNOSIS — E559 Vitamin D deficiency, unspecified: Secondary | ICD-10-CM | POA: Diagnosis not present

## 2017-11-25 DIAGNOSIS — Z79899 Other long term (current) drug therapy: Secondary | ICD-10-CM | POA: Diagnosis not present

## 2017-11-25 DIAGNOSIS — M79609 Pain in unspecified limb: Secondary | ICD-10-CM | POA: Diagnosis not present

## 2017-12-02 ENCOUNTER — Other Ambulatory Visit: Payer: Self-pay

## 2017-12-20 DIAGNOSIS — I10 Essential (primary) hypertension: Secondary | ICD-10-CM | POA: Diagnosis not present

## 2017-12-20 DIAGNOSIS — Z6827 Body mass index (BMI) 27.0-27.9, adult: Secondary | ICD-10-CM | POA: Diagnosis not present

## 2017-12-20 DIAGNOSIS — Z1389 Encounter for screening for other disorder: Secondary | ICD-10-CM | POA: Diagnosis not present

## 2017-12-20 DIAGNOSIS — E782 Mixed hyperlipidemia: Secondary | ICD-10-CM | POA: Diagnosis not present

## 2017-12-20 DIAGNOSIS — K2270 Barrett's esophagus without dysplasia: Secondary | ICD-10-CM | POA: Diagnosis not present

## 2017-12-20 DIAGNOSIS — Z23 Encounter for immunization: Secondary | ICD-10-CM | POA: Diagnosis not present

## 2017-12-23 DIAGNOSIS — K219 Gastro-esophageal reflux disease without esophagitis: Secondary | ICD-10-CM | POA: Diagnosis present

## 2017-12-23 DIAGNOSIS — M19012 Primary osteoarthritis, left shoulder: Secondary | ICD-10-CM | POA: Diagnosis present

## 2017-12-23 DIAGNOSIS — Z7982 Long term (current) use of aspirin: Secondary | ICD-10-CM | POA: Diagnosis not present

## 2017-12-23 DIAGNOSIS — I252 Old myocardial infarction: Secondary | ICD-10-CM | POA: Diagnosis not present

## 2017-12-23 DIAGNOSIS — I10 Essential (primary) hypertension: Secondary | ICD-10-CM | POA: Diagnosis present

## 2017-12-23 DIAGNOSIS — M5416 Radiculopathy, lumbar region: Secondary | ICD-10-CM | POA: Diagnosis not present

## 2017-12-23 DIAGNOSIS — G8929 Other chronic pain: Secondary | ICD-10-CM | POA: Diagnosis present

## 2017-12-23 DIAGNOSIS — Z79899 Other long term (current) drug therapy: Secondary | ICD-10-CM | POA: Diagnosis not present

## 2017-12-23 DIAGNOSIS — M48062 Spinal stenosis, lumbar region with neurogenic claudication: Secondary | ICD-10-CM | POA: Diagnosis present

## 2017-12-23 DIAGNOSIS — E785 Hyperlipidemia, unspecified: Secondary | ICD-10-CM | POA: Diagnosis present

## 2017-12-23 DIAGNOSIS — H9190 Unspecified hearing loss, unspecified ear: Secondary | ICD-10-CM | POA: Diagnosis present

## 2017-12-23 DIAGNOSIS — M7918 Myalgia, other site: Secondary | ICD-10-CM | POA: Diagnosis not present

## 2017-12-23 DIAGNOSIS — I2510 Atherosclerotic heart disease of native coronary artery without angina pectoris: Secondary | ICD-10-CM | POA: Diagnosis present

## 2017-12-23 DIAGNOSIS — Z87891 Personal history of nicotine dependence: Secondary | ICD-10-CM | POA: Diagnosis not present

## 2017-12-23 DIAGNOSIS — M48061 Spinal stenosis, lumbar region without neurogenic claudication: Secondary | ICD-10-CM | POA: Diagnosis not present

## 2017-12-23 DIAGNOSIS — M5116 Intervertebral disc disorders with radiculopathy, lumbar region: Secondary | ICD-10-CM | POA: Diagnosis present

## 2017-12-26 DIAGNOSIS — Z981 Arthrodesis status: Secondary | ICD-10-CM | POA: Diagnosis not present

## 2017-12-26 DIAGNOSIS — Z4789 Encounter for other orthopedic aftercare: Secondary | ICD-10-CM | POA: Diagnosis not present

## 2017-12-26 DIAGNOSIS — Z9181 History of falling: Secondary | ICD-10-CM | POA: Diagnosis not present

## 2017-12-30 DIAGNOSIS — Z981 Arthrodesis status: Secondary | ICD-10-CM | POA: Diagnosis not present

## 2017-12-30 DIAGNOSIS — Z4789 Encounter for other orthopedic aftercare: Secondary | ICD-10-CM | POA: Diagnosis not present

## 2017-12-30 DIAGNOSIS — Z9181 History of falling: Secondary | ICD-10-CM | POA: Diagnosis not present

## 2018-01-03 DIAGNOSIS — Z981 Arthrodesis status: Secondary | ICD-10-CM | POA: Diagnosis not present

## 2018-01-03 DIAGNOSIS — Z4789 Encounter for other orthopedic aftercare: Secondary | ICD-10-CM | POA: Diagnosis not present

## 2018-01-03 DIAGNOSIS — Z9181 History of falling: Secondary | ICD-10-CM | POA: Diagnosis not present

## 2018-01-06 DIAGNOSIS — Z4789 Encounter for other orthopedic aftercare: Secondary | ICD-10-CM | POA: Diagnosis not present

## 2018-01-06 DIAGNOSIS — Z9181 History of falling: Secondary | ICD-10-CM | POA: Diagnosis not present

## 2018-01-06 DIAGNOSIS — Z981 Arthrodesis status: Secondary | ICD-10-CM | POA: Diagnosis not present

## 2018-01-17 DIAGNOSIS — Z981 Arthrodesis status: Secondary | ICD-10-CM | POA: Diagnosis not present

## 2018-01-17 DIAGNOSIS — Z4789 Encounter for other orthopedic aftercare: Secondary | ICD-10-CM | POA: Diagnosis not present

## 2018-01-17 DIAGNOSIS — Z9181 History of falling: Secondary | ICD-10-CM | POA: Diagnosis not present

## 2018-02-21 DIAGNOSIS — M48062 Spinal stenosis, lumbar region with neurogenic claudication: Secondary | ICD-10-CM | POA: Diagnosis not present

## 2018-03-03 DIAGNOSIS — R0600 Dyspnea, unspecified: Secondary | ICD-10-CM | POA: Diagnosis not present

## 2018-03-03 DIAGNOSIS — I2510 Atherosclerotic heart disease of native coronary artery without angina pectoris: Secondary | ICD-10-CM | POA: Diagnosis not present

## 2018-03-07 ENCOUNTER — Ambulatory Visit (INDEPENDENT_AMBULATORY_CARE_PROVIDER_SITE_OTHER): Payer: Medicare Other | Admitting: Cardiology

## 2018-03-07 ENCOUNTER — Encounter: Payer: Self-pay | Admitting: Cardiology

## 2018-03-07 VITALS — BP 118/72 | HR 69 | Ht 72.0 in | Wt 207.0 lb

## 2018-03-07 DIAGNOSIS — R0609 Other forms of dyspnea: Secondary | ICD-10-CM | POA: Diagnosis not present

## 2018-03-07 DIAGNOSIS — I429 Cardiomyopathy, unspecified: Secondary | ICD-10-CM | POA: Insufficient documentation

## 2018-03-07 DIAGNOSIS — I447 Left bundle-branch block, unspecified: Secondary | ICD-10-CM | POA: Diagnosis not present

## 2018-03-07 DIAGNOSIS — I10 Essential (primary) hypertension: Secondary | ICD-10-CM

## 2018-03-07 DIAGNOSIS — I25119 Atherosclerotic heart disease of native coronary artery with unspecified angina pectoris: Secondary | ICD-10-CM | POA: Insufficient documentation

## 2018-03-07 DIAGNOSIS — I110 Hypertensive heart disease with heart failure: Secondary | ICD-10-CM

## 2018-03-07 DIAGNOSIS — I2510 Atherosclerotic heart disease of native coronary artery without angina pectoris: Secondary | ICD-10-CM

## 2018-03-07 HISTORY — DX: Atherosclerotic heart disease of native coronary artery without angina pectoris: I25.10

## 2018-03-07 HISTORY — DX: Hypertensive heart disease with heart failure: I11.0

## 2018-03-07 HISTORY — DX: Cardiomyopathy, unspecified: I42.9

## 2018-03-07 NOTE — Progress Notes (Signed)
Cardiology Office Note:    Date:  03/07/2018   ID:  Glenn Sullivan, DOB 1940-09-19, MRN [id number]  PCP:  Garwin Brothers, MD  Cardiologist:  Jenean Lindau, MD   Referring MD: Townsend Roger, MD    ASSESSMENT:    1. Dyspnea on exertion   2. Coronary artery disease involving native coronary artery of native heart without angina pectoris   3. Cardiomyopathy, unspecified type ([place])   4. Essential hypertension   5. Left bundle branch block (LBBB)    PLAN:    In order of problems listed above:  1. Secondary prevention stressed with the patient.  Importance of compliance with diet and medication stressed and he vocalized understanding. 2. His blood pressure is stable.  Diet was discussed for dyslipidemia.  His lab work recently was done by her primary care physician. 3. Echocardiogram will be done to assess murmur heard on auscultation and it will help me assess status of his cardiomyopathy.  He will have a Lexiscan sestamibi to assess shortness of breath on exertion. 4. He will be seen in follow-up appointment in a month or earlier if he has any concerns to review the above results.  He knows to go to the nearest emergency room for any concerning symptoms.   Medication Adjustments/Labs and Tests Ordered: Current medicines are reviewed at length with the patient today.  Concerns regarding medicines are outlined above.  No orders of the defined types were placed in this encounter.  No orders of the defined types were placed in this encounter.    No chief complaint on file.    History of Present Illness:    Glenn Sullivan is a 78 y.o. male patient is a pleasant 78 year old male.  He has past medical history of essential hypertension, left bundle branch block.  He has been noticing shortness of breath on exertion for the past several months.  No chest pain orthopnea or PND.  This patient has been under my care in my previous practice.  He is here now to transfer his care and be established  with my current practice.  At the time of my evaluation, the patient is alert awake oriented and in no distress.  He is accompanied by his wife and this is the reason he is referred to me.  Past Medical History:  Diagnosis Date  . AAA (abdominal aortic aneurysm) ([place])   . Barrett's esophagus   . Myocardial infarction [facility])     Past Surgical History:  Procedure Laterality Date  . ABDOMINAL AORTIC ANEURYSM REPAIR    . EVAR    . RENAL ARTERY STENT      Current Medications: Current Meds  Medication Sig  . aspirin EC 81 MG tablet Take 81 mg by mouth daily.  Marland Kitchen atorvastatin (LIPITOR) 40 MG tablet Take 40 mg by mouth daily.  Marland Kitchen ENTRESTO 49-51 MG Take 1 tablet by mouth 2 (two) times daily.  Marland Kitchen omeprazole (PRILOSEC) 20 MG capsule Take 20 mg by mouth every morning.     Allergies:   Tetanus toxoids   Social History   Socioeconomic History  . Marital status: Married    Spouse name: Not on file  . Number of children: Not on file  . Years of education: Not on file  . Highest education level: Not on file  Occupational History  . Not on file  Social Needs  . Financial resource strain: Not on file  . Food insecurity:    Worry: Not on file  Inability: Not on file  . Transportation needs:    Medical: Not on file    Non-medical: Not on file  Tobacco Use  . Smoking status: Former Smoker    Types: Cigarettes    Last attempt to quit: 01/16/2007    Years since quitting: 11.1  . Smokeless tobacco: Never Used  Substance and Sexual Activity  . Alcohol use: Yes    Alcohol/week: 6.0 - 8.0 standard drinks    Types: 3 - 4 Glasses of wine, 3 - 4 Cans of beer per week  . Drug use: No  . Sexual activity: Not on file  Lifestyle  . Physical activity:    Days per week: Not on file    Minutes per session: Not on file  . Stress: Not on file  Relationships  . Social connections:    Talks on phone: Not on file    Gets together: Not on file    Attends religious service: Not on file    Active  member of club or organization: Not on file    Attends meetings of clubs or organizations: Not on file    Relationship status: Not on file  Other Topics Concern  . Not on file  Social History Narrative  . Not on file     Family History: The patient's family history includes Diabetes in his mother; Heart disease in his mother; Hypertension in his mother; Varicose Veins in his mother.  ROS:   Please see the history of present illness.    All other systems reviewed and are negative.  EKGs/Labs/Other Studies Reviewed:    The following studies were reviewed today: I reviewed records from primary care doctor's office and he has left bundle branch block on his EKG.   Recent Labs: No results found for requested labs within last 8760 hours.  Recent Lipid Panel No results found for: CHOL, TRIG, HDL, CHOLHDL, VLDL, LDLCALC, LDLDIRECT  Physical Exam:    VS:  BP 118/72 (BP Location: Right Arm, Patient Position: Sitting, Cuff Size: Normal)   Pulse 69   Ht 6' (1.829 m)   Wt 207 lb (93.9 kg)   SpO2 98%   BMI 28.07 kg/m     Wt Readings from Last 3 Encounters:  03/07/18 207 lb (93.9 kg)  12/24/13 213 lb (96.6 kg)     GEN: Patient is in no acute distress HEENT: Normal NECK: No JVD; No carotid bruits LYMPHATICS: No lymphadenopathy CARDIAC: Hear sounds regular, 2/6 systolic murmur at the apex. RESPIRATORY:  Clear to auscultation without rales, wheezing or rhonchi  ABDOMEN: Soft, non-tender, non-distended MUSCULOSKELETAL:  No edema; No deformity  SKIN: Warm and dry NEUROLOGIC:  Alert and oriented x 3 PSYCHIATRIC:  Normal affect   Signed, Jenean Lindau, MD  03/07/2018 11:45 AM    [place]

## 2018-03-07 NOTE — Addendum Note (Signed)
Addended by: Anselm Pancoast on: 03/07/2018 03:29 PM   Modules accepted: Orders

## 2018-03-07 NOTE — Patient Instructions (Signed)
Medication Instructions:  Your physician recommends that you continue on your current medications as directed. Please refer to the Current Medication list given to you today.  If you need a refill on your cardiac medications before your next appointment, please call your pharmacy.   Lab work: None  If you have labs (blood work) drawn today and your tests are completely normal, you will receive your results only by: Marland Kitchen MyChart Message (if you have MyChart) OR . A paper copy in the mail If you have any lab test that is abnormal or we need to change your treatment, we will call you to review the results.  Testing/Procedures: Your physician has requested that you have an echocardiogram. Echocardiography is a painless test that uses sound waves to create images of your heart. It provides your doctor with information about the size and shape of your heart and how well your heart's chambers and valves are working. This procedure takes approximately one hour. There are no restrictions for this procedure.  Your physician has requested that you have a lexiscan myoview. For further information please visit [email or web address]. Please follow instruction sheet, as given.    Follow-Up: At [facility], you and your health needs are our priority.  As part of our continuing mission to provide you with exceptional heart care, we have created designated Provider Care Teams.  These Care Teams include your primary Cardiologist (physician) and Advanced Practice Providers (APPs -  Physician Assistants and Nurse Practitioners) who all work together to provide you with the care you need, when you need it. You will need a follow up appointment in 1 months.  Please call our office 2 months in advance to schedule this appointment.  You may see No primary care provider on file. or another member of our [employer] Provider Team in : Jenne Campus, MD . Shirlee More, MD  Any Other Special Instructions  Will Be Listed Below (If Applicable).

## 2018-03-13 ENCOUNTER — Ambulatory Visit (INDEPENDENT_AMBULATORY_CARE_PROVIDER_SITE_OTHER): Payer: Medicare Other

## 2018-03-13 DIAGNOSIS — I429 Cardiomyopathy, unspecified: Secondary | ICD-10-CM | POA: Diagnosis not present

## 2018-03-13 DIAGNOSIS — I2510 Atherosclerotic heart disease of native coronary artery without angina pectoris: Secondary | ICD-10-CM | POA: Diagnosis not present

## 2018-03-13 DIAGNOSIS — R0609 Other forms of dyspnea: Secondary | ICD-10-CM | POA: Diagnosis not present

## 2018-03-13 DIAGNOSIS — I10 Essential (primary) hypertension: Secondary | ICD-10-CM | POA: Diagnosis not present

## 2018-03-13 DIAGNOSIS — I447 Left bundle-branch block, unspecified: Secondary | ICD-10-CM

## 2018-03-13 NOTE — Progress Notes (Signed)
2d Echo has been performed.

## 2018-03-14 ENCOUNTER — Telehealth: Payer: Self-pay

## 2018-03-14 DIAGNOSIS — I2510 Atherosclerotic heart disease of native coronary artery without angina pectoris: Secondary | ICD-10-CM | POA: Diagnosis not present

## 2018-03-14 MED ORDER — SACUBITRIL-VALSARTAN 24-26 MG PO TABS: 1.0000 | ORAL_TABLET | Freq: Two times a day (BID) | ORAL | 3 refills | Status: DC

## 2018-03-14 MED ORDER — FUROSEMIDE 20 MG PO TABS: 20.0000 mg | ORAL_TABLET | Freq: Every day | ORAL | 3 refills | Status: DC

## 2018-03-14 NOTE — Telephone Encounter (Signed)
-----   Message from Jenean Lindau, MD sent at 03/14/2018 10:32 AM EST ----- Severely depressed left ventricular systolic function.  Reduce Entresto dose to 24/26 mg, the lower dose. Add Furosemide 20 mg daily.  Appointment made to see me in the next 8 to 10 days. Jenean Lindau, MD 03/14/2018 10:31 AM

## 2018-03-14 NOTE — Telephone Encounter (Signed)
Pt has been advised of medication changes and appointment has been made for f/u w/RRR. Pt denies any further questions at this time.

## 2018-03-14 NOTE — Telephone Encounter (Signed)
Left message for pt to return call to discuss medication changes and schedule f/u appointment.

## 2018-03-14 NOTE — Addendum Note (Signed)
Addended by: Kathyrn Sheriff on: 03/14/2018 12:16 PM   Modules accepted: Orders

## 2018-03-19 DIAGNOSIS — R0600 Dyspnea, unspecified: Secondary | ICD-10-CM | POA: Diagnosis not present

## 2018-03-19 DIAGNOSIS — R0602 Shortness of breath: Secondary | ICD-10-CM | POA: Diagnosis not present

## 2018-03-26 DIAGNOSIS — I428 Other cardiomyopathies: Secondary | ICD-10-CM | POA: Diagnosis not present

## 2018-03-26 DIAGNOSIS — E785 Hyperlipidemia, unspecified: Secondary | ICD-10-CM | POA: Diagnosis not present

## 2018-03-26 DIAGNOSIS — I2510 Atherosclerotic heart disease of native coronary artery without angina pectoris: Secondary | ICD-10-CM | POA: Diagnosis not present

## 2018-03-26 DIAGNOSIS — R0609 Other forms of dyspnea: Secondary | ICD-10-CM | POA: Diagnosis not present

## 2018-03-26 DIAGNOSIS — G8929 Other chronic pain: Secondary | ICD-10-CM | POA: Diagnosis not present

## 2018-03-31 ENCOUNTER — Ambulatory Visit (INDEPENDENT_AMBULATORY_CARE_PROVIDER_SITE_OTHER): Payer: Medicare Other | Admitting: Cardiology

## 2018-03-31 ENCOUNTER — Other Ambulatory Visit: Payer: Self-pay

## 2018-03-31 ENCOUNTER — Encounter: Payer: Self-pay | Admitting: Cardiology

## 2018-03-31 VITALS — BP 118/66 | HR 78 | Ht 72.0 in | Wt 205.0 lb

## 2018-03-31 DIAGNOSIS — I2510 Atherosclerotic heart disease of native coronary artery without angina pectoris: Secondary | ICD-10-CM | POA: Diagnosis not present

## 2018-03-31 DIAGNOSIS — I10 Essential (primary) hypertension: Secondary | ICD-10-CM

## 2018-03-31 DIAGNOSIS — Z8679 Personal history of other diseases of the circulatory system: Secondary | ICD-10-CM

## 2018-03-31 DIAGNOSIS — I429 Cardiomyopathy, unspecified: Secondary | ICD-10-CM | POA: Diagnosis not present

## 2018-03-31 DIAGNOSIS — Z9889 Other specified postprocedural states: Secondary | ICD-10-CM

## 2018-03-31 DIAGNOSIS — E785 Hyperlipidemia, unspecified: Secondary | ICD-10-CM

## 2018-03-31 LAB — BASIC METABOLIC PANEL
BUN/Creatinine Ratio: 12 (ref 10–24)
BUN: 13 mg/dL (ref 8–27)
CO2: 24 mmol/L (ref 20–29)
Calcium: 9 mg/dL (ref 8.6–10.2)
Chloride: 100 mmol/L (ref 96–106)
Creatinine, Ser: 1.09 mg/dL (ref 0.76–1.27)
GFR calc Af Amer: 75 mL/min/{1.73_m2} (ref >59)
GFR calc non Af Amer: 65 mL/min/{1.73_m2} (ref >59)
Glucose: 100 mg/dL — ABNORMAL HIGH (ref 65–99)
Potassium: 4.8 mmol/L (ref 3.5–5.2)
Sodium: 138 mmol/L (ref 134–144)

## 2018-03-31 NOTE — Addendum Note (Signed)
Addended by: Beckey Rutter on: 03/31/2018 10:01 AM   Modules accepted: Orders

## 2018-03-31 NOTE — Patient Instructions (Signed)
Medication Instructions:  Your physician has recommended you make the following change in your medication: STOP taking furosemide If you need a refill on your cardiac medications before your next appointment, please call your pharmacy.   Lab work: Your physician recommends that you have a BMP drawn today.   If you have labs (blood work) drawn today and your tests are completely normal, you will receive your results only by: Marland Kitchen MyChart Message (if you have MyChart) OR . A paper copy in the mail If you have any lab test that is abnormal or we need to change your treatment, we will call you to review the results.  Testing/Procedures: NONE  Follow-Up: At [facility], you and your health needs are our priority.  As part of our continuing mission to provide you with exceptional heart care, we have created designated Provider Care Teams.  These Care Teams include your primary Cardiologist (physician) and Advanced Practice Providers (APPs -  Physician Assistants and Nurse Practitioners) who all work together to provide you with the care you need, when you need it. You will need a follow up appointment in 3 months.

## 2018-03-31 NOTE — Progress Notes (Signed)
Cardiology Office Note:    Date:  03/31/2018   ID:  Glenn Sullivan, DOB 09-Oct-1940, MRN [id number]  PCP:  Garwin Brothers, MD  Cardiologist:  Jenean Lindau, MD   Referring MD: Garwin Brothers, MD    ASSESSMENT:    1. Cardiomyopathy, unspecified type ([place])   2. Coronary artery disease involving native coronary artery of native heart without angina pectoris   3. Essential hypertension   4. Dyslipidemia   5. H/O aortic aneurysm repair    PLAN:    In order of problems listed above:  1. Secondary prevention stressed with the patient.  Importance of compliance with diet and medication stressed and he vocalized understanding.  His blood pressure is stable.  In view of symptoms suggesting of postural hypotension I will request the patient to stop diuretic.  He denies any pedal edema on any such issues.  We will do a Chem-7 today. 2. He will have a Lexiscan sestamibi to rule out any ischemia as a cause of this progression and cardiomyopathy.  I discussed issues such as electrophysiology evaluation, event monitoring and defibrillator as possible management strategies for the future.  He is vehemently against any such therapies as defibrillator and benefits and risks explained to him and he vocalized understanding.  His wife and patient had questions which were answered to their satisfaction. 3. Patient will be seen in follow-up appointment in 3 months or earlier if the patient has any concerns    Medication Adjustments/Labs and Tests Ordered: Current medicines are reviewed at length with the patient today.  Concerns regarding medicines are outlined above.  Orders Placed This Encounter  Procedures   Basic Metabolic Panel (BMET)   No orders of the defined types were placed in this encounter.    No chief complaint on file.    History of Present Illness:    Glenn Sullivan is a 78 y.o. male.  Patient has nonobstructive coronary artery disease by coronary angiography in 2017.  Subsequently he has  done fine.  He takes care of activities of daily living.  No chest pain orthopnea or PND.  He is on Entresto.  He gives symptoms suggesting of postural hypotension at times when he changes posture.  He denies any shortness of breath or any pedal edema.  Past Medical History:  Diagnosis Date   AAA (abdominal aortic aneurysm) ([facility])    Barrett's esophagus    Myocardial infarction [facility])     Past Surgical History:  Procedure Laterality Date   ABDOMINAL AORTIC ANEURYSM REPAIR     EVAR     RENAL ARTERY STENT      Current Medications: Current Meds  Medication Sig   aspirin EC 81 MG tablet Take 81 mg by mouth daily.   atorvastatin (LIPITOR) 40 MG tablet Take 40 mg by mouth daily.   furosemide (LASIX) 20 MG tablet Take 1 tablet (20 mg total) by mouth daily.   omeprazole (PRILOSEC) 20 MG capsule Take 20 mg by mouth every morning.   sacubitril-valsartan (ENTRESTO) 24-26 MG Take 1 tablet by mouth 2 (two) times daily.     Allergies:   Tetanus toxoids   Social History   Socioeconomic History   Marital status: Married    Spouse name: Not on file   Number of children: Not on file   Years of education: Not on file   Highest education level: Not on file  Occupational History   Not on file  Social Needs   Financial resource strain: Not on file  Food insecurity:    Worry: Not on file    Inability: Not on file   Transportation needs:    Medical: Not on file    Non-medical: Not on file  Tobacco Use   Smoking status: Former Smoker    Types: Cigarettes    Last attempt to quit: 01/16/2007    Years since quitting: 11.2   Smokeless tobacco: Never Used  Substance and Sexual Activity   Alcohol use: Yes    Alcohol/week: 6.0 - 8.0 standard drinks    Types: 3 - 4 Glasses of wine, 3 - 4 Cans of beer per week   Drug use: No   Sexual activity: Not on file  Lifestyle   Physical activity:    Days per week: Not on file    Minutes per session: Not on file   Stress: Not  on file  Relationships   Social connections:    Talks on phone: Not on file    Gets together: Not on file    Attends religious service: Not on file    Active member of club or organization: Not on file    Attends meetings of clubs or organizations: Not on file    Relationship status: Not on file  Other Topics Concern   Not on file  Social History Narrative   Not on file     Family History: The patient's family history includes Diabetes in his mother; Heart disease in his mother; Hypertension in his mother; Varicose Veins in his mother.  ROS:   Please see the history of present illness.    All other systems reviewed and are negative.  EKGs/Labs/Other Studies Reviewed:    The following studies were reviewed today: I discussed echocardiogram report with the patient at extensive length.   Recent Labs: No results found for requested labs within last 8760 hours.  Recent Lipid Panel No results found for: CHOL, TRIG, HDL, CHOLHDL, VLDL, LDLCALC, LDLDIRECT  Physical Exam:    VS:  BP 118/66 (BP Location: Right Arm, Patient Position: Sitting, Cuff Size: Normal)    Pulse 78    Ht 6' (1.829 m)    Wt 205 lb (93 kg)    SpO2 96%    BMI 27.80 kg/m     Wt Readings from Last 3 Encounters:  03/31/18 205 lb (93 kg)  03/07/18 207 lb (93.9 kg)  12/24/13 213 lb (96.6 kg)     GEN: Patient is in no acute distress HEENT: Normal NECK: No JVD; No carotid bruits LYMPHATICS: No lymphadenopathy CARDIAC: Hear sounds regular, 2/6 systolic murmur at the apex. RESPIRATORY:  Clear to auscultation without rales, wheezing or rhonchi  ABDOMEN: Soft, non-tender, non-distended MUSCULOSKELETAL:  No edema; No deformity  SKIN: Warm and dry NEUROLOGIC:  Alert and oriented x 3 PSYCHIATRIC:  Normal affect   Signed, Jenean Lindau, MD  03/31/2018 9:04 AM    [place]

## 2018-04-01 ENCOUNTER — Telehealth: Payer: Self-pay

## 2018-04-01 NOTE — Telephone Encounter (Signed)
-----   Message from Jenean Lindau, MD sent at 03/31/2018  4:54 PM EDT ----- The results of the study is unremarkable. Please inform patient. I will discuss in detail at next appointment. Cc  primary care/referring physician Jenean Lindau, MD 03/31/2018 4:54 PM

## 2018-04-01 NOTE — Telephone Encounter (Signed)
Called patient and left detailed voice message on patients phone regarding lab results. 

## 2018-04-04 ENCOUNTER — Ambulatory Visit: Payer: Medicare Other | Admitting: Cardiology

## 2018-04-21 ENCOUNTER — Telehealth: Payer: Self-pay

## 2018-04-21 ENCOUNTER — Other Ambulatory Visit: Payer: Self-pay | Admitting: Cardiology

## 2018-04-21 DIAGNOSIS — I447 Left bundle-branch block, unspecified: Secondary | ICD-10-CM | POA: Diagnosis not present

## 2018-04-21 DIAGNOSIS — I501 Left ventricular failure: Secondary | ICD-10-CM | POA: Diagnosis not present

## 2018-04-21 DIAGNOSIS — Z79899 Other long term (current) drug therapy: Secondary | ICD-10-CM | POA: Diagnosis not present

## 2018-04-21 DIAGNOSIS — I252 Old myocardial infarction: Secondary | ICD-10-CM | POA: Diagnosis not present

## 2018-04-21 DIAGNOSIS — I712 Thoracic aortic aneurysm, without rupture: Secondary | ICD-10-CM | POA: Diagnosis present

## 2018-04-21 DIAGNOSIS — Z87891 Personal history of nicotine dependence: Secondary | ICD-10-CM | POA: Diagnosis not present

## 2018-04-21 DIAGNOSIS — E785 Hyperlipidemia, unspecified: Secondary | ICD-10-CM | POA: Diagnosis present

## 2018-04-21 DIAGNOSIS — M1990 Unspecified osteoarthritis, unspecified site: Secondary | ICD-10-CM | POA: Diagnosis present

## 2018-04-21 DIAGNOSIS — I110 Hypertensive heart disease with heart failure: Secondary | ICD-10-CM | POA: Diagnosis not present

## 2018-04-21 DIAGNOSIS — I2510 Atherosclerotic heart disease of native coronary artery without angina pectoris: Secondary | ICD-10-CM | POA: Diagnosis not present

## 2018-04-21 DIAGNOSIS — I509 Heart failure, unspecified: Secondary | ICD-10-CM | POA: Diagnosis not present

## 2018-04-21 DIAGNOSIS — J810 Acute pulmonary edema: Secondary | ICD-10-CM | POA: Diagnosis not present

## 2018-04-21 DIAGNOSIS — J439 Emphysema, unspecified: Secondary | ICD-10-CM | POA: Diagnosis present

## 2018-04-21 DIAGNOSIS — I10 Essential (primary) hypertension: Secondary | ICD-10-CM | POA: Diagnosis not present

## 2018-04-21 DIAGNOSIS — R05 Cough: Secondary | ICD-10-CM | POA: Diagnosis not present

## 2018-04-21 DIAGNOSIS — J90 Pleural effusion, not elsewhere classified: Secondary | ICD-10-CM | POA: Diagnosis not present

## 2018-04-21 DIAGNOSIS — Z7982 Long term (current) use of aspirin: Secondary | ICD-10-CM | POA: Diagnosis not present

## 2018-04-21 DIAGNOSIS — R0602 Shortness of breath: Secondary | ICD-10-CM | POA: Diagnosis not present

## 2018-04-21 DIAGNOSIS — I5023 Acute on chronic systolic (congestive) heart failure: Secondary | ICD-10-CM | POA: Diagnosis present

## 2018-04-21 MED ORDER — ATORVASTATIN CALCIUM 40 MG PO TABS: 40.0000 mg | ORAL_TABLET | Freq: Every day | ORAL | 0 refills | Status: DC

## 2018-04-21 NOTE — Telephone Encounter (Signed)
Pt states he has been having intermittent shortness of breath since 04/02/18. He was told to discontinue lasix on 03/31/18 during office visit. He says he feels like someone is sucking air out of him, he only gets relief sitting upright. He is hyperoxygenating by breathing rapidly through his mouth. He is unable to check oxygenation at home and does not use oxygen. Patient states at one time he was on symbicort and this was years ago but does not currently have refills. It helped. He sits outside for 5 or more hours per day and having itchy eyes and wonders if it is causing breathing issues. Breathing issues are mainly at bedtime. He can not sleep. He only walks from bedroom to restroom and gets SOB. Some mild swelling around both ankles. No urination issues. RN forwarded note to Dr.RRR for advisement.

## 2018-04-21 NOTE — Telephone Encounter (Signed)
Rx sent to pharmacy   

## 2018-04-21 NOTE — Telephone Encounter (Signed)
Patients wife called back very upset that she feels his condition is not improving. She states he may be able to ambulate to car but having a really difficult time breathing. RN instructed her to call 911 because they have oxygen and immediate medical alternatives available. Wife states he is in car and she will take him to [facility] for evaluation. Rn contacted Dr.RRR to bring him up to speed via telephone and routed telephone call to him via Epic.

## 2018-04-21 NOTE — Telephone Encounter (Signed)
°*  STAT* If patient is at the pharmacy, call can be transferred to refill team.   1. Which medications need to be refilled? (please list name of each medication and dose if known) Atorvastatin  2. Which pharmacy/location (including street and city if local pharmacy) is medication to be sent to? [facility] 3. Do they need a 30 day or 90 day supply? 90 day

## 2018-04-22 DIAGNOSIS — I712 Thoracic aortic aneurysm, without rupture: Secondary | ICD-10-CM

## 2018-04-22 DIAGNOSIS — I501 Left ventricular failure: Secondary | ICD-10-CM

## 2018-04-22 DIAGNOSIS — I5023 Acute on chronic systolic (congestive) heart failure: Secondary | ICD-10-CM

## 2018-04-22 NOTE — Telephone Encounter (Signed)
Follow up call due to patient ED visit yesterday. Patient is doing better, states he was able to sleep last night. He had EKG, labs, chest ct and cta performed and [facility] yesterday. Patient put back on furosemide. No further concerns at this time. RN sent note to Dr. Docia Furl to update.

## 2018-04-23 ENCOUNTER — Other Ambulatory Visit: Payer: Self-pay | Admitting: Cardiology

## 2018-04-25 DIAGNOSIS — I5042 Chronic combined systolic (congestive) and diastolic (congestive) heart failure: Secondary | ICD-10-CM | POA: Insufficient documentation

## 2018-04-25 DIAGNOSIS — I5043 Acute on chronic combined systolic (congestive) and diastolic (congestive) heart failure: Secondary | ICD-10-CM | POA: Insufficient documentation

## 2018-04-25 DIAGNOSIS — I447 Left bundle-branch block, unspecified: Secondary | ICD-10-CM | POA: Insufficient documentation

## 2018-04-25 DIAGNOSIS — I5022 Chronic systolic (congestive) heart failure: Secondary | ICD-10-CM | POA: Insufficient documentation

## 2018-04-25 HISTORY — DX: Left bundle-branch block, unspecified: I44.7

## 2018-04-25 HISTORY — DX: Chronic combined systolic (congestive) and diastolic (congestive) heart failure: I50.42

## 2018-04-25 HISTORY — DX: Chronic systolic (congestive) heart failure: I50.22

## 2018-04-28 ENCOUNTER — Other Ambulatory Visit: Payer: Self-pay | Admitting: Cardiology

## 2018-04-28 ENCOUNTER — Telehealth (INDEPENDENT_AMBULATORY_CARE_PROVIDER_SITE_OTHER): Payer: Medicare Other | Admitting: Cardiology

## 2018-04-28 ENCOUNTER — Other Ambulatory Visit: Payer: Self-pay

## 2018-04-28 ENCOUNTER — Encounter: Payer: Self-pay | Admitting: Cardiology

## 2018-04-28 VITALS — HR 70 | Ht 72.0 in | Wt 198.0 lb

## 2018-04-28 DIAGNOSIS — I5022 Chronic systolic (congestive) heart failure: Secondary | ICD-10-CM | POA: Diagnosis not present

## 2018-04-28 DIAGNOSIS — I447 Left bundle-branch block, unspecified: Secondary | ICD-10-CM | POA: Diagnosis not present

## 2018-04-28 DIAGNOSIS — I25118 Atherosclerotic heart disease of native coronary artery with other forms of angina pectoris: Secondary | ICD-10-CM | POA: Diagnosis not present

## 2018-04-28 DIAGNOSIS — I255 Ischemic cardiomyopathy: Secondary | ICD-10-CM

## 2018-04-28 DIAGNOSIS — R197 Diarrhea, unspecified: Secondary | ICD-10-CM | POA: Insufficient documentation

## 2018-04-28 HISTORY — DX: Diarrhea, unspecified: R19.7

## 2018-04-28 LAB — BASIC METABOLIC PANEL
BUN/Creatinine Ratio: 20 (ref 10–24)
BUN: 23 mg/dL (ref 8–27)
CO2: 22 mmol/L (ref 20–29)
Calcium: 9.4 mg/dL (ref 8.6–10.2)
Chloride: 97 mmol/L (ref 96–106)
Creatinine, Ser: 1.13 mg/dL (ref 0.76–1.27)
GFR calc Af Amer: 72 mL/min/{1.73_m2} (ref >59)
GFR calc non Af Amer: 62 mL/min/{1.73_m2} (ref >59)
Glucose: 78 mg/dL (ref 65–99)
Potassium: 5 mmol/L (ref 3.5–5.2)
Sodium: 136 mmol/L (ref 134–144)

## 2018-04-28 LAB — PRO B NATRIURETIC PEPTIDE: NT-Pro BNP: 6422 pg/mL — ABNORMAL HIGH (ref 0–486)

## 2018-04-28 MED ORDER — TORSEMIDE 20 MG PO TABS: 20.0000 mg | ORAL_TABLET | Freq: Once | ORAL | 3 refills | Status: DC

## 2018-04-28 NOTE — Patient Instructions (Signed)
Medication Instructions:  START taking torsemide 20 mg (1 tablet) once daily If you need a refill on your cardiac medications before your next appointment, please call your pharmacy.   Lab work: Your physician recommends that you have a BMP, PROBNP done today  If you have labs (blood work) drawn today and your tests are completely normal, you will receive your results only by: Marland Kitchen MyChart Message (if you have MyChart) OR . A paper copy in the mail If you have any lab test that is abnormal or we need to change your treatment, we will call you to review the results.  Testing/Procedures: You will need to go to [facility] to have a C.difficile toxin sample done.  Follow-Up: At [facility], you and your health needs are our priority.  As part of our continuing mission to provide you with exceptional heart care, we have created designated Provider Care Teams.  These Care Teams include your primary Cardiologist (physician) and Advanced Practice Providers (APPs -  Physician Assistants and Nurse Practitioners) who all work together to provide you with the care you need, when you need it. You will need a follow up appointment in 2 weeks.   Any Other Special Instructions Will Be Listed Below Torsemide tablets What is this medicine? TORSEMIDE (TORE se mide) is a diuretic. It helps you make more urine and to lose salt and excess water from your body. This medicine is used to treat high blood pressure, and edema or swelling from heart, kidney, or liver disease. This medicine may be used for other purposes; ask your health care provider or pharmacist if you have questions. COMMON BRAND NAME(S): Demadex What should I tell my health care provider before I take this medicine? They need to know if you have any of these conditions: -abnormal blood electrolytes -diabetes -gout -heart disease -kidney disease -liver disease -small amounts of urine, or difficulty passing urine -an unusual or allergic  reaction to torsemide, sulfa drugs, other medicines, foods, dyes, or preservatives -pregnant or trying to get pregnant -breast-feeding How should I use this medicine? Take this medicine by mouth with a glass of water. Follow the directions on the prescription label. You may take this medicine with or without food. If it upsets your stomach, take it with food or milk. Do not take your medicine more often than directed. Remember that you will need to pass more urine after taking this medicine. Do not take your medicine at a time of day that will cause you problems. Do not take at bedtime. Talk to your pediatrician regarding the use of this medicine in children. Special care may be needed. Overdosage: If you think you have taken too much of this medicine contact a poison control center or emergency room at once. NOTE: This medicine is only for you. Do not share this medicine with others. What if I miss a dose? If you miss a dose, take it as soon as you can. If it is almost time for your next dose, take only that dose. Do not take double or extra doses. What may interact with this medicine? -alcohol -certain antibiotics given by injection certain heart medicines like digoxin -diuretics -lithium -medicines for diabetes -medicines for blood pressure -medicines for cholesterol like cholestyramine -medicines that relax muscles for surgery -NSAIDs, medicines for pain and inflammation, like ibuprofen or naproxen -OTC supplements like ginseng and ephedra -probenecid -steroid medicines like prednisone or cortisone This list may not describe all possible interactions. Give your health care provider a  list of all the medicines, herbs, non-prescription drugs, or dietary supplements you use. Also tell them if you smoke, drink alcohol, or use illegal drugs. Some items may interact with your medicine. What should I watch for while using this medicine? Visit your doctor or health care professional for regular  checks on your progress. Check your blood pressure regularly. Ask your doctor or health care professional what your blood pressure should be, and when you should contact him or her. If you are a diabetic, check your blood sugar as directed. You may need to be on a special diet while taking this medicine. Check with your doctor. Also, ask how many glasses of fluid you need to drink a day. You must not get dehydrated. You may get drowsy or dizzy. Do not drive, use machinery, or do anything that needs mental alertness until you know how this drug affects you. Do not stand or sit up quickly, especially if you are an older patient. This reduces the risk of dizzy or fainting spells. Alcohol can make you more drowsy and dizzy. Avoid alcoholic drinks. What side effects may I notice from receiving this medicine? Side effects that you should report to your doctor or health care professional as soon as possible: -allergic reactions such as skin rash or itching, hives, swelling of the lips, mouth, tongue or throat -blood in urine or stool -dry mouth -hearing loss or ringing in the ears -irregular heartbeat -muscle pain, weakness or cramps -pain or difficulty passing urine -unusually weak or tired -vomiting or diarrhea Side effects that usually do not require medical attention (report to your doctor or health care professional if they continue or are bothersome): -dizzy or lightheaded -headache -increased thirst -passing large amounts of urine -sexual difficulties -stomach pain, upset or nausea This list may not describe all possible side effects. Call your doctor for medical advice about side effects. You may report side effects to FDA at 1-800-FDA-1088. Where should I keep my medicine? Keep out of the reach of children. Store at room temperature between 15 and 30 degrees C (59 and 86 degrees F). Throw away any unused medicine after the expiration date. NOTE: This sheet is a summary. It may not cover  all possible information. If you have questions about this medicine, talk to your doctor, pharmacist, or health care provider.  2019 Elsevier/Gold Standard (2007-09-18 11:35:45)

## 2018-04-28 NOTE — Progress Notes (Signed)
He was scheduled as a virtual visit patient drove himself to our office and presented here and I made a decision to transition to an office visit reluctantly with a recent decompensation and ED visit.  Evaluation Performed:  Follow-up visit  Date:  04/28/2018   ID:  Glenn Sullivan, DOB Jun 05, 1940, MRN [id number]    PCP:  Garwin Brothers, MD    Chief Complaint:  Recent admission for heart failure  History of Present Illness:    Glenn Sullivan is a 78 y.o. male who presents for follow-up after [facility] ED visit 04/21/2018 with decompensated heart failure He has a history of CAD cath 2017 with EF  35% mild to moderate diffuse CAD and no target for revascularization, heart failure, hypertension,severe cardiomyopathy, LBBB and PAD last seen Dr Geraldo Pitter 03/31/18. Presently he is on a loop diuretic and Entresto. He has declined an ICD after discussion by Dr Geraldo Pitter.He was seen at [facility] ED 04/21/18 with decompensated heart failure after his diuretic was stopped due to lightheadedness.  I have requested those records  Echo 03/13/18: IMPRESSIONS  1. The left ventricle has severely reduced systolic function, with an ejection fraction of 20-25%. The cavity size was normal. Left ventricular diastolic Doppler parameters are consistent with pseudonormalization.  2. The right ventricle has normal systolic function. The cavity was normal. There is no increase in right ventricular wall thickness.  3. Left atrial size was mildly dilated.  4. The mitral valve is normal in structure. Mitral valve regurgitation is mild to moderate by color flow Doppler.  5. The tricuspid valve is normal in structure.  6. The aortic valve is tricuspid Mild calcification of the aortic valve.  7. The pulmonic valve was normal in structure.  8. Severe akinesis of the left ventricular septal.   The patient does not have symptoms concerning for COVID-19 infection (fever, chills, cough, or new shortness of breath).    Past Medical  History:  Diagnosis Date  . AAA (abdominal aortic aneurysm) ([place])   . Barrett's esophagus   . Myocardial infarction [facility])    Past Surgical History:  Procedure Laterality Date  . ABDOMINAL AORTIC ANEURYSM REPAIR    . EVAR    . RENAL ARTERY STENT       Current Meds  Medication Sig  . aspirin EC 81 MG tablet Take 81 mg by mouth daily.  Marland Kitchen atorvastatin (LIPITOR) 40 MG tablet Take 1 tablet (40 mg total) by mouth daily.  . sacubitril-valsartan (ENTRESTO) 49-51 MG Take 1 tablet by mouth 2 (two) times daily.  . [DISCONTINUED] furosemide (LASIX) 20 MG tablet TAKE 1 TABLET BY MOUTH EVERY DAY  . [DISCONTINUED] KLOR-CON M10 10 MEQ tablet Take 10 mEq by mouth daily.  . [DISCONTINUED] omeprazole (PRILOSEC) 20 MG capsule Take 20 mg by mouth every morning.  . [DISCONTINUED] sacubitril-valsartan (ENTRESTO) 24-26 MG Take 1 tablet by mouth 2 (two) times daily.     Allergies:   Tetanus toxoids   Social History   Tobacco Use  . Smoking status: Former Smoker    Types: Cigarettes    Last attempt to quit: 01/16/2007    Years since quitting: 11.2  . Smokeless tobacco: Never Used  Substance Use Topics  . Alcohol use: Yes    Alcohol/week: 6.0 - 8.0 standard drinks    Types: 3 - 4 Glasses of wine, 3 - 4 Cans of beer per week  . Drug use: No     Family Hx: The patient's family history includes Diabetes in his mother;  Heart disease in his mother; Hypertension in his mother; Varicose Veins in his mother.  ROS:   Please see the history of present illness.    He has been having loose stools and fecal incontinence All other systems reviewed and are negative.   Prior CV studies:   The following studies were reviewed today:  Reviewed his [facility] records before the visit.  Is admitted decompensated heart failure his proBNP level was severely elevated greater than 15,000 d-dimer was elevated CTA showed no evidence of pulmonary embolism and findings of decompensated heart failure and pulmonary  hypertension CBC CMP were normal potassium 4.2 creatinine 1.0 hemoglobin 14.6.  He was seen by cardiologist Dr. Donivan Scull during the hospitalization diuresed and placed back on maintenance furosemide  Labs/Other Tests and Data Reviewed:    EKG:  An ECG dated 02/26/18 was personally reviewed today and demonstrated:  [facility] LBBB QRSD 180 msec  Recent Labs: 03/31/2018: BUN 13; Creatinine, Ser 1.09; Potassium 4.8; Sodium 138   Recent Lipid Panel   01/21/16 Chol 151 TG 100 HDL 69 LDL 62  No results found for: CHOL, TRIG, HDL, CHOLHDL, LDLCALC, LDLDIRECT  Wt Readings from Last 3 Encounters:  04/28/18 198 lb (89.8 kg)  03/31/18 205 lb (93 kg)  03/07/18 207 lb (93.9 kg)     Objective:    Vital Signs:  Pulse 70   Ht 6' (1.829 m)   Wt 198 lb (89.8 kg)   SpO2 99%   BMI 26.85 kg/m    Physical Exam  Constitutional: He is oriented to person, place, and time. He appears well-developed and well-nourished.  Eyes: Conjunctivae are normal.  Neck: No JVD present.  Cardiovascular: Normal rate, regular rhythm, normal heart sounds and intact distal pulses. Exam reveals no gallop and no friction rub.  No murmur heard. Pulmonary/Chest: Breath sounds normal. No respiratory distress. He has no wheezes. He has no rales. He exhibits no tenderness.  Abdominal: He exhibits no distension. There is no abdominal tenderness.  Musculoskeletal: Normal range of motion.  Neurological: He is alert and oriented to person, place, and time.  Skin: Skin is warm and dry.  Psychiatric: He has a normal mood and affect. His behavior is normal. Judgment and thought content normal.  He had no bruit or edema  ASSESSMENT & PLAN:    1. Failure chronic systolic improved he is adamant that his diarrhea may be related to furosemide switch him to torsemide 20 mg daily check BMP proBNP level stressed the importance of self-management with sodium restriction daily weights blood pressure checks. 2. CAD stable at this time do not think he  needs an ischemia evaluation electively we will do a myocardial perfusion study 3. [facility] EKG with left bundle branch block same pattern no ischemic changes personally reviewed that EKG 4. Severe cardiomyopathy next visit if improved start beta-blocker uptitrate Entresto.  We had nice discussion and he does agree to see EP electively and will consider an ICD 5. Unfortunately he was in the hospital at risk for more have a stool check for C. difficile colitis.  Potassium furosemide and Prilosec switch to Entresto check stool for C. difficile toxin  COVID-19 Education: The signs and symptoms of COVID-19 were discussed with the patient and how to seek care for testing (follow up with PCP or arrange E-visit).  The importance of social distancing was discussed today.    Medication Adjustments/Labs and Tests Ordered: Current medicines are reviewed at length with the patient today.  Concerns regarding  medicines are outlined above.  Tests Ordered: Orders Placed This Encounter  Procedures  . Basic Metabolic Panel (BMET)  . Pro b natriuretic peptide (BNP)  . Clostridium difficile Toxin B, Qualitative, Real-Time PCR([facility])   Medication Changes: Meds ordered this encounter  Medications  . torsemide (DEMADEX) 20 MG tablet    Sig: Take 1 tablet (20 mg total) by mouth once for 1 dose.    Dispense:  30 tablet    Refill:  3    Disposition:  Follow up in 1 week(s)  Signed, Shirlee More, MD  04/28/2018 9:54 AM    [facility]

## 2018-04-29 ENCOUNTER — Telehealth: Payer: Self-pay

## 2018-04-29 MED ORDER — TORSEMIDE 20 MG PO TABS: ORAL_TABLET | ORAL | 3 refills | Status: DC

## 2018-04-29 NOTE — Telephone Encounter (Signed)
-----   Message from Richardo Priest, MD sent at 04/28/2018  4:43 PM EDT ----- His BNP remains elevated , increase his furoosemide to BID on M W and F

## 2018-04-29 NOTE — Telephone Encounter (Signed)
Patient advised of lab results with elevated BNP.  Patient advised to increase torssemide.  He will take torsemide 20mg  daily except twice daily on Mon, Wed, and Fri.  Patient agreed to plan and verbalized understanding.

## 2018-04-30 LAB — CLOSTRIDIUM DIFFICILE EIA: C difficile Toxins A+B, EIA: NEGATIVE

## 2018-05-12 ENCOUNTER — Telehealth: Payer: Self-pay

## 2018-05-12 NOTE — Telephone Encounter (Signed)
YOUR CARDIOLOGY TEAM HAS ARRANGED FOR AN E-VISIT FOR YOUR APPOINTMENT - PLEASE REVIEW IMPORTANT INFORMATION BELOW SEVERAL DAYS PRIOR TO YOUR APPOINTMENT  Due to the recent COVID-19 pandemic, we are transitioning in-person office visits to tele-medicine visits in an effort to decrease unnecessary exposure to our patients, their families, and staff. These visits are billed to your insurance just like a normal visit is. We also encourage you to sign up for MyChart if you have not already done so. You will need a smartphone if possible. For patients that do not have this, we can still complete the visit using a regular telephone but do prefer a smartphone to enable video when possible. You may have a family member that lives with you that can help. If possible, we also ask that you have a blood pressure cuff and scale at home to measure your blood pressure, heart rate and weight prior to your scheduled appointment. Patients with clinical needs that need an in-person evaluation and testing will still be able to come to the office if absolutely necessary. If you have any questions, feel free to call our office.     YOUR PROVIDER WILL BE USING THE FOLLOWING PLATFORM TO COMPLETE YOUR VISIT: DoxyMe  . IF USING DOXIMITY or [email or web address].ME - The staff will give you instructions on receiving your link to join the meeting the day of your visit.      2-3 DAYS BEFORE YOUR APPOINTMENT  You will receive a telephone call from one of our [place] team members - your caller ID may say "Unknown caller." If this is a video visit, we will walk you through how to get the video launched on your phone. We will remind you check your blood pressure, heart rate and weight prior to your scheduled appointment. If you have an Apple Watch or Kardia, please upload any pertinent ECG strips the day before or morning of your appointment to [facility]. Our staff will also make sure you have reviewed the consent and agree to move forward with your  scheduled tele-health visit.     THE DAY OF YOUR APPOINTMENT  Approximately 15 minutes prior to your scheduled appointment, you will receive a telephone call from one of [place] team - your caller ID may say "Unknown caller."  Our staff will confirm medications, vital signs for the day and any symptoms you may be experiencing. Please have this information available prior to the time of visit start. It may also be helpful for you to have a pad of paper and pen handy for any instructions given during your visit. They will also walk you through joining the smartphone meeting if this is a video visit.    CONSENT FOR TELE-HEALTH VISIT - PLEASE REVIEW  I hereby voluntarily request, consent and authorize [facility] and its employed or contracted physicians, physician assistants, nurse practitioners or other licensed health care professionals (the Practitioner), to provide me with telemedicine health care services (the "Services") as deemed necessary by the treating Practitioner. I acknowledge and consent to receive the Services by the Practitioner via telemedicine. I understand that the telemedicine visit will involve communicating with the Practitioner through live audiovisual communication technology and the disclosure of certain medical information by electronic transmission. I acknowledge that I have been given the opportunity to request an in-person assessment or other available alternative prior to the telemedicine visit and am voluntarily participating in the telemedicine visit.  I understand that I have the right to withhold or withdraw my consent to the  use of telemedicine in the course of my care at any time, without affecting my right to future care or treatment, and that the Practitioner or I may terminate the telemedicine visit at any time. I understand that I have the right to inspect all information obtained and/or recorded in the course of the telemedicine visit and may receive copies of  available information for a reasonable fee.  I understand that some of the potential risks of receiving the Services via telemedicine include:  Marland Kitchen Delay or interruption in medical evaluation due to technological equipment failure or disruption; . Information transmitted may not be sufficient (e.g. poor resolution of images) to allow for appropriate medical decision making by the Practitioner; and/or  . In rare instances, security protocols could fail, causing a breach of personal health information.  Furthermore, I acknowledge that it is my responsibility to provide information about my medical history, conditions and care that is complete and accurate to the best of my ability. I acknowledge that Practitioner's advice, recommendations, and/or decision may be based on factors not within their control, such as incomplete or inaccurate data provided by me or distortions of diagnostic images or specimens that may result from electronic transmissions. I understand that the practice of medicine is not an exact science and that Practitioner makes no warranties or guarantees regarding treatment outcomes. I acknowledge that I will receive a copy of this consent concurrently upon execution via email to the email address I last provided but may also request a printed copy by calling the office of [place].    I understand that my insurance will be billed for this visit.   I have read or had this consent read to me. . I understand the contents of this consent, which adequately explains the benefits and risks of the Services being provided via telemedicine.  . I have been provided ample opportunity to ask questions regarding this consent and the Services and have had my questions answered to my satisfaction. . I give my informed consent for the services to be provided through the use of telemedicine in my medical care  By participating in this telemedicine visit I agree to the above. Patient gave verbal  consent to virtual visit with Dr [name] on 05-13-2018.

## 2018-05-13 ENCOUNTER — Telehealth (INDEPENDENT_AMBULATORY_CARE_PROVIDER_SITE_OTHER): Payer: Medicare Other | Admitting: Cardiology

## 2018-05-13 ENCOUNTER — Encounter: Payer: Self-pay | Admitting: Cardiology

## 2018-05-13 ENCOUNTER — Other Ambulatory Visit: Payer: Self-pay

## 2018-05-13 VITALS — BP 108/67 | HR 73 | Ht 72.0 in | Wt 192.0 lb

## 2018-05-13 DIAGNOSIS — I5022 Chronic systolic (congestive) heart failure: Secondary | ICD-10-CM | POA: Diagnosis not present

## 2018-05-13 DIAGNOSIS — I25118 Atherosclerotic heart disease of native coronary artery with other forms of angina pectoris: Secondary | ICD-10-CM

## 2018-05-13 DIAGNOSIS — R197 Diarrhea, unspecified: Secondary | ICD-10-CM

## 2018-05-13 DIAGNOSIS — Z7189 Other specified counseling: Secondary | ICD-10-CM

## 2018-05-13 DIAGNOSIS — I110 Hypertensive heart disease with heart failure: Secondary | ICD-10-CM

## 2018-05-13 NOTE — Patient Instructions (Addendum)
/  Medication Instructions:  Your physician recommends that you continue on your current medications as directed. Please refer to the Current Medication list given to you today.  If you need a refill on your cardiac medications before your next appointment, please call your pharmacy.   Lab work: None  If you have labs (blood work) drawn today and your tests are completely normal, you will receive your results only by: Marland Kitchen MyChart Message (if you have MyChart) OR . A paper copy in the mail If you have any lab test that is abnormal or we need to change your treatment, we will call you to review the results.  Testing/Procedures: None  Follow-Up: At [facility], you and your health needs are our priority.  As part of our continuing mission to provide you with exceptional heart care, we have created designated Provider Care Teams.  These Care Teams include your primary Cardiologist (physician) and Advanced Practice Providers (APPs -  Physician Assistants and Nurse Practitioners) who all work together to provide you with the care you need, when you need it. You will need a follow up virtual appointment in 1 months: Friday, 06/13/2018, at 10:30 am.

## 2018-05-13 NOTE — Progress Notes (Signed)
Virtual Visit via Video Note   This visit type was conducted due to national recommendations for restrictions regarding the COVID-19 Pandemic (e.g. social distancing) in an effort to limit this patient's exposure and mitigate transmission in our community.  Due to his co-morbid illnesses, this patient is at least at moderate risk for complications without adequate follow up.  This format is felt to be most appropriate for this patient at this time.  All issues noted in this document were discussed and addressed.  A limited physical exam was performed with this format.  Please refer to the patient's chart for his consent to telehealth for [facility].   Evaluation Performed:  Follow-up visit  Date:  05/13/2018   ID:  Glenn Sullivan, DOB 06/22/1940, MRN [id number]  Patient Location: Home Provider Location: Home  PCP:  Garwin Brothers, MD  Cardiologist:  No primary care provider on file. Dr [name] Electrophysiologist:  None   Chief Complaint: Follow-up for heart failure  History of Present Illness:    Glenn Sullivan is a 78 y.o. male with CAD cardiomyopathy EF 20-25%  heart failure and LBBB QRSD 180 msec last seen 04/28/18 after [facility] ED visit with decompensated heart failure.  He has a history of CAD cath 2017 with EF  35% mild to moderate diffuse CAD and no target for revascularization at left heart cath 11/04/15, heart failure, hypertension,severe cardiomyopathy, LBBB and PAD last seen Dr Geraldo Pitter 03/31/18. Presently he is on a loop diuretic and Entresto. He has declined an ICD after discussion by Dr Geraldo Pitter.He was seen at [facility] ED 04/21/18 with decompensated heart failure after his diuretic was stopped due to lightheadedness.  I have requested those records   Echo 03/13/18: IMPRESSIONS  1. The left ventricle has severely reduced systolic function, with an ejection fraction of 20-25%. The cavity size was normal. Left ventricular diastolic Doppler parameters are consistent with pseudonormalization.  2. The right ventricle has normal systolic function. The cavity was normal. There is no increase in right ventricular wall thickness.  3. Left atrial size was mildly dilated.  4. The mitral valve is normal in structure. Mitral valve regurgitation is mild to moderate by color flow Doppler.  5. The tricuspid valve is normal in structure.  6. The aortic valve is tricuspid Mild calcification of the aortic valve.  7. The pulmonic valve was normal in structure.  8. Severe akinesis of the left ventricular septal.   The patient does not have symptoms concerning for COVID-19 infection (fever, chills, cough, or new shortness of breath).   He is better weight is stable no edema shortness of breath orthopnea chest pain palpitation or syncope.  Walking every day strength and endurance are good and diarrhea has resolved.  In short he is now New York Heart Association class I and compensated.  We again discussed ICD therapy he is in favor and I will see him back in the office in 1 month for further discussion and referral to EP.  He is not having heartburn and I asked him to stay off his PPI because diarrhea could have been due to to it.  Stool for C. difficile was normal Past Medical History:  Diagnosis Date  . AAA (abdominal aortic aneurysm) ([place])   . Barrett's esophagus   . Myocardial infarction [facility])    Past Surgical History:  Procedure Laterality Date  . ABDOMINAL AORTIC ANEURYSM REPAIR    . EVAR    . RENAL ARTERY STENT       Current Meds  Medication Sig  . aspirin EC 81 MG tablet Take 81 mg by mouth daily.  Marland Kitchen atorvastatin (LIPITOR) 40 MG tablet Take 1 tablet (40 mg total) by mouth daily.  . sacubitril-valsartan (ENTRESTO) 49-51 MG Take 1 tablet by mouth 2 (two) times daily.  Marland Kitchen torsemide (DEMADEX) 20 MG tablet Take 1 tablet daily, except 2 tablets on Mon, Wed, and Fri     Allergies:   Tetanus toxoids   Social History   Tobacco Use  . Smoking status: Former Smoker    Types: Cigarettes     Last attempt to quit: 01/16/2007    Years since quitting: 11.3  . Smokeless tobacco: Never Used  Substance Use Topics  . Alcohol use: Yes    Alcohol/week: 6.0 - 8.0 standard drinks    Types: 3 - 4 Glasses of wine, 3 - 4 Cans of beer per week  . Drug use: No     Family Hx: The patient's family history includes Diabetes in his mother; Heart disease in his mother; Hypertension in his mother; Varicose Veins in his mother.  ROS:   Please see the history of present illness.     All other systems reviewed and are negative.   Prior CV studies:   The following studies were reviewed today:    Labs/Other Tests and Data Reviewed:    EKG:  No ECG reviewed.  Recent Labs:  Ref Range & Units 2wk ago   NT-Pro BNP 0 - 486 pg/mL 6,422High     Ref Range & Units 2wk ago  C difficile Toxins A+B, EIA Negative Negative   Resulting Agency  [facility]   04/28/2018: BUN 23; Creatinine, Ser 1.13; NT-Pro BNP 6,422; Potassium 5.0; Sodium 136   Recent Lipid Panel No results found for: CHOL, TRIG, HDL, CHOLHDL, LDLCALC, LDLDIRECT  Wt Readings from Last 3 Encounters:  05/13/18 192 lb (87.1 kg)  04/28/18 198 lb (89.8 kg)  03/31/18 205 lb (93 kg)     Objective:    Vital Signs:  BP 108/67 (BP Location: Left Arm, Patient Position: Sitting)   Pulse 73   Ht 6' (1.829 m)   Wt 192 lb (87.1 kg)   BMI 26.04 kg/m    Constitutional, well-nourished well-developed in no acute distress Vital signs reviewed Eyes, conjunctiva and sclera are normal without pallor or icterus extraocular motions intact and normal there is no lid lag Respiratory, normal effort and excursion no audible wheezing without a stethoscope Cardiovascular, no neck vein distention or peripheral edema Skin, no rash skin lesion or ulceration of the extremities Neurologic, cranial nerves II to XII are grossly intact and the patient moves all 4 extremities Neuro/Psychiatric, judgment and thought processes are intact and coherent, alert and  oriented x3, mood and affect appear normal.  ASSESSMENT & PLAN:    1. Congestive heart failure, chronic, systolic improved presently asymptomatic compensated New York Heart Association class I continue his current diuretic Entresto at the next office visit we will introduce a minimum dose of beta-blocker for class effect and spironolactone.  We again discussed the role of ICD CAD heart failure reduced ejection fraction he is in favor and I will make arrangements for EP referral at that time after the peak of COVID-19 as this is an elective procedure. 2. Hypertensive heart disease improved no longer has symptomatic orthostatic hypotension continue his current diuretic vasodilator. 3. Diarrhea resolved multiple potential etiologies including potassium supplement PPI and fortunately did not have C. difficile colitis after his brief hospitalization 4. CAD stable  I will make arrangements for a myocardial perfusion study at his next visit continue medical therapy including his high intensity statin with dyslipidemia and high risk  COVID-19 Education: The signs and symptoms of COVID-19 were discussed with the patient and how to seek care for testing (follow up with PCP or arrange E-visit).  The importance of social distancing was discussed today.  Time:   Today, I have spent 20 minutes with the patient with telehealth technology discussing the above problems.     Medication Adjustments/Labs and Tests Ordered: Current medicines are reviewed at length with the patient today.  Concerns regarding medicines are outlined above.   Tests Ordered: No orders of the defined types were placed in this encounter.   Medication Changes: No orders of the defined types were placed in this encounter.   Disposition:  Follow up in 4 week(s)  Signed, Shirlee More, MD  05/13/2018 8:23 AM    [facility]

## 2018-05-17 DIAGNOSIS — S61211A Laceration without foreign body of left index finger without damage to nail, initial encounter: Secondary | ICD-10-CM | POA: Diagnosis not present

## 2018-05-17 DIAGNOSIS — S61221A Laceration with foreign body of left index finger without damage to nail, initial encounter: Secondary | ICD-10-CM | POA: Diagnosis not present

## 2018-05-26 DIAGNOSIS — I509 Heart failure, unspecified: Secondary | ICD-10-CM | POA: Diagnosis not present

## 2018-05-26 DIAGNOSIS — I5023 Acute on chronic systolic (congestive) heart failure: Secondary | ICD-10-CM | POA: Diagnosis not present

## 2018-05-26 DIAGNOSIS — S61211D Laceration without foreign body of left index finger without damage to nail, subsequent encounter: Secondary | ICD-10-CM | POA: Diagnosis not present

## 2018-06-12 NOTE — Progress Notes (Signed)
Virtual Visit via Video Note   This visit type was conducted due to national recommendations for restrictions regarding the COVID-19 Pandemic (e.g. social distancing) in an effort to limit this patient's exposure and mitigate transmission in our community.  Due to his co-morbid illnesses, this patient is at least at moderate risk for complications without adequate follow up.  This format is felt to be most appropriate for this patient at this time.  All issues noted in this document were discussed and addressed.  A limited physical exam was performed with this format.  Please refer to the patient's chart for his consent to telehealth for [facility].   Date:  06/12/2018   ID:  [name], DOB 29-May-1940, MRN [id number]  Patient Location: Home Provider Location: Office  PCP:  Garwin Brothers, MD  Cardiologist:  Shirlee More, MD  Electrophysiologist:  None   Evaluation Performed:  Follow-Up Visit  Chief Complaint:  Heart failure  History of Present Illness:    Glenn Sullivan is a 78 y.o. male last seen 05/13/18.  He has a history of CAD cath 2017 with EF  35% mild to moderate diffuse CAD and no target for revascularization, heart failure, hypertension,severe cardiomyopathy, LBBB and PAD last seen Dr Geraldo Pitter 03/31/18. Presently he is on a loop diuretic and Entresto. He has declined an ICD after discussion by Dr Geraldo Pitter.He was [facility] at [facility] ED 04/21/18 with decompensated heart failure after his diuretic was stopped due to lightheadedness.     Echo 03/13/18: IMPRESSIONS  1. The left ventricle has severely reduced systolic function, with an ejection fraction of 20-25%. The cavity size was normal. Left ventricular diastolic Doppler parameters are consistent with pseudonormalization.  2. The right ventricle has normal systolic function. The cavity was normal. There is no increase in right ventricular wall thickness.  3. Left atrial size was mildly dilated.  4. The mitral valve is normal in  structure. Mitral valve regurgitation is mild to moderate by color flow Doppler.  5. The tricuspid valve is normal in structure.  6. The aortic valve is tricuspid Mild calcification of the aortic valve.  7. The pulmonic valve was normal in structure.  8. Severe akinesis of the left ventricular septal.  He continues to have mild orthostatic lightheadedness and I do not think I may be able to uptitrate his Entresto or add a beta-blocker at this time.  I will recheck his labs and if his potassium and renal function are normal place him on low-dose MRA.  Otherwise he is feeling well and has no exercise intolerance edema shortness of breath chest pain palpitation or syncope and he is going to purchase knee-high medium intensity support stockings to try to mitigate orthostatic shifting blood pressure.  He is interested in understands the reason and accepts ICD therapy and I will refer him to EP with a history of coronary artery disease left bundle branch block and heart failure with severe left ventricular dysfunction.   The patient does not have symptoms concerning for COVID-19 infection (fever, chills, cough, or new shortness of breath).    Past Medical History:  Diagnosis Date  . AAA (abdominal aortic aneurysm) ([place])   . Barrett's esophagus   . Myocardial infarction [facility])    Past Surgical History:  Procedure Laterality Date  . ABDOMINAL AORTIC ANEURYSM REPAIR    . EVAR    . RENAL ARTERY STENT       No outpatient medications have been marked as taking for the 06/13/18 encounter (Appointment) with  Richardo Priest, MD.     Allergies:   Tetanus toxoids   Social History   Tobacco Use  . Smoking status: Former Smoker    Types: Cigarettes    Last attempt to quit: 01/16/2007    Years since quitting: 11.4  . Smokeless tobacco: Never Used  Substance Use Topics  . Alcohol use: Yes    Alcohol/week: 6.0 - 8.0 standard drinks    Types: 3 - 4 Glasses of wine, 3 - 4 Cans of beer per week  . Drug  use: No     Family Hx: The patient's family history includes Diabetes in his mother; Heart disease in his mother; Hypertension in his mother; Varicose Veins in his mother.  ROS:   Please see the history of present illness.     All other systems reviewed and are negative.   Prior CV studies:   The following studies were reviewed today:    Labs/Other Tests and Data Reviewed:    EKG:  An ECG dated 03/07/18 was personally reviewed today and demonstrated:  Sinus rhythm left bundle branch block QRS 180 ms  Recent Labs: 04/28/2018: BUN 23; Creatinine, Ser 1.13; NT-Pro BNP 6,422; Potassium 5.0; Sodium 136   Ref Range & Units 73mo ago  NT-Pro BNP 0 - 486 pg/mL 6,422High      Recent Lipid Panel No results found for: CHOL, TRIG, HDL, CHOLHDL, LDLCALC, LDLDIRECT  Wt Readings from Last 3 Encounters:  05/13/18 192 lb (87.1 kg)  04/28/18 198 lb (89.8 kg)  03/31/18 205 lb (93 kg)     Objective:    Vital Signs:  There were no vitals taken for this visit.   VITAL SIGNS:  reviewed GEN:  no acute distress EYES:  sclerae anicteric, EOMI - Extraocular Movements Intact RESPIRATORY:  normal respiratory effort, symmetric expansion CARDIOVASCULAR:  no peripheral edema SKIN:  no rash, lesions or ulcers. MUSCULOSKELETAL:  no obvious deformities. NEURO:  alert and oriented x 3, no obvious focal deficit PSYCH:  normal affect    ASSESSMENT & PLAN:    1. Heart failure chronic systolic presently compensated New York Heart Association class I continue his current diuretic recheck renal function proBNP and consider adding MRA depending on potassium and unfortunately cannot uptitrate Entresto or initiate a beta-blocker at this time. 2. Hypertensive heart disease stable he is having orthostatic symptoms and asked him to start wearing knee-high medium support hose to mitigate orthostatic shifting blood pressure 3. CAD stable continue medical therapy 4. Left bundle branch block his QRS duration is  markedly prolonged and then refer him for consideration of ICD therapy may benefit from biventricular device 5. Cardiomyopathy severe with CAD refer for ICD consultation 6. Dyslipidemia continue statin check liver function safety lipids efficacy  COVID-19 Education: The signs and symptoms of COVID-19 were discussed with the patient and how to seek care for testing (follow up with PCP or arrange E-visit).  The importance of social distancing was discussed today.  Time:   Today, I have spent 25 minutes with the patient with telehealth technology discussing the above problems.     Medication Adjustments/Labs and Tests Ordered: Current medicines are reviewed at length with the patient today.  Concerns regarding medicines are outlined above.   Tests Ordered: No orders of the defined types were placed in this encounter.   Medication Changes: No orders of the defined types were placed in this encounter.   Disposition:  Follow up in 3 month(s)  Signed, Shirlee More, MD  06/12/2018  1:24 PM    [facility]

## 2018-06-13 ENCOUNTER — Other Ambulatory Visit: Payer: Self-pay

## 2018-06-13 ENCOUNTER — Telehealth (INDEPENDENT_AMBULATORY_CARE_PROVIDER_SITE_OTHER): Payer: Medicare Other | Admitting: Cardiology

## 2018-06-13 ENCOUNTER — Encounter: Payer: Self-pay | Admitting: Cardiology

## 2018-06-13 ENCOUNTER — Telehealth: Payer: Self-pay | Admitting: *Deleted

## 2018-06-13 VITALS — BP 117/75 | Ht 72.0 in | Wt 190.0 lb

## 2018-06-13 DIAGNOSIS — I5022 Chronic systolic (congestive) heart failure: Secondary | ICD-10-CM | POA: Diagnosis not present

## 2018-06-13 DIAGNOSIS — Z95810 Presence of automatic (implantable) cardiac defibrillator: Secondary | ICD-10-CM

## 2018-06-13 DIAGNOSIS — E785 Hyperlipidemia, unspecified: Secondary | ICD-10-CM

## 2018-06-13 DIAGNOSIS — I25118 Atherosclerotic heart disease of native coronary artery with other forms of angina pectoris: Secondary | ICD-10-CM

## 2018-06-13 DIAGNOSIS — I447 Left bundle-branch block, unspecified: Secondary | ICD-10-CM

## 2018-06-13 DIAGNOSIS — I110 Hypertensive heart disease with heart failure: Secondary | ICD-10-CM

## 2018-06-13 DIAGNOSIS — I255 Ischemic cardiomyopathy: Secondary | ICD-10-CM

## 2018-06-13 NOTE — Telephone Encounter (Signed)
Consent previously taken from [facility]. Verified appointment with patient. Patient stated previus video visit had issues and had to be Phone call.

## 2018-06-13 NOTE — Patient Instructions (Addendum)
Medication Instructions:  Your physician recommends that you continue on your current medications as directed. Please refer to the Current Medication list given to you today.  If you need a refill on your cardiac medications before your next appointment, please call your pharmacy.   Lab work: Your physician recommends that you return for lab work next week: CMP, lipid profile, ProBNP. Please go to the [place] office for lab work, no appointment needed. No need to fast beforehand.   If you have labs (blood work) drawn today and your tests are completely normal, you will receive your results only by: Marland Kitchen MyChart Message (if you have MyChart) OR . A paper copy in the mail If you have any lab test that is abnormal or we need to change your treatment, we will call you to review the results.  Testing/Procedures: You have been referred to see an electrophysiologist, Dr. Curt Bears. You will be contacted to schedule this appointment.   Follow-Up: At [facility], you and your health needs are our priority.  As part of our continuing mission to provide you with exceptional heart care, we have created designated Provider Care Teams.  These Care Teams include your primary Cardiologist (physician) and Advanced Practice Providers (APPs -  Physician Assistants and Nurse Practitioners) who all work together to provide you with the care you need, when you need it. You will need a follow up appointment in 3 months: Monday, 09/15/2018, at 11:30 am in the [place] office.

## 2018-06-16 ENCOUNTER — Telehealth (INDEPENDENT_AMBULATORY_CARE_PROVIDER_SITE_OTHER): Payer: Medicare Other | Admitting: Cardiology

## 2018-06-16 ENCOUNTER — Other Ambulatory Visit: Payer: Self-pay

## 2018-06-16 DIAGNOSIS — I5022 Chronic systolic (congestive) heart failure: Secondary | ICD-10-CM

## 2018-06-16 NOTE — Progress Notes (Signed)
Virtual Visit via Telephone Note   This visit type was conducted due to national recommendations for restrictions regarding the COVID-19 Pandemic (e.g. social distancing) in an effort to limit this patient's exposure and mitigate transmission in our community.  Due to his co-morbid illnesses, this patient is at least at moderate risk for complications without adequate follow up.  This format is felt to be most appropriate for this patient at this time.  The patient did not have access to video technology/had technical difficulties with video requiring transitioning to audio format only (telephone).  All issues noted in this document were discussed and addressed.  No physical exam could be performed with this format.  Please refer to the patient's chart for his  consent to telehealth for [facility]   Date:  06/16/2018   ID:  Glenn Sullivan, DOB [birth year], MRN [id number]  Patient Location: Home Provider Location: Home  PCP:  Garwin Brothers, MD  Cardiologist:  Shirlee More, MD  Electrophysiologist:  None   Evaluation Performed:  Consultation - Glenn Sullivan was referred by Shirlee More for the evaluation of CHF.  Chief Complaint:  CHF  History of Present Illness:    Glenn Sullivan is a 78 y.o. male with a history significant for coronary artery disease, chronic systolic heart failure, hypertension, left bundle branch block, PAD.  He is currently on optimal medical therapy with Entresto and beta-blockers.  He recently presented to [facility] with decompensated heart failure.  Unfortunately his diuretic was stopped due to lightheadedness.  He is currently feeling well without chest pain or shortness of breath.  His main complaint is dizziness.  He gets dizzy with standing or moving his head.  Otherwise he is doing well.  He did take his blood pressures today.  His left arm was in the high 99B systolic with his right arm in the 130s.  The patient does not have symptoms concerning for COVID-19  infection (fever, chills, cough, or new shortness of breath).    Past Medical History:  Diagnosis Date  . AAA (abdominal aortic aneurysm) ([place])   . Barrett's esophagus   . Myocardial infarction [facility])    Past Surgical History:  Procedure Laterality Date  . ABDOMINAL AORTIC ANEURYSM REPAIR    . EVAR    . RENAL ARTERY STENT       Current Meds  Medication Sig  . aspirin EC 81 MG tablet Take 81 mg by mouth daily.  Marland Kitchen atorvastatin (LIPITOR) 40 MG tablet Take 1 tablet (40 mg total) by mouth daily.  Marland Kitchen omeprazole (PRILOSEC) 20 MG capsule Take 20 mg by mouth daily.  . sacubitril-valsartan (ENTRESTO) 49-51 MG Take 1 tablet by mouth 2 (two) times daily.  Marland Kitchen torsemide (DEMADEX) 20 MG tablet Take 1 tablet daily, except 2 tablets on Mon, Wed, and Fri     Allergies:   Tetanus toxoids   Social History   Tobacco Use  . Smoking status: Former Smoker    Types: Cigarettes    Last attempt to quit: 01/16/2007    Years since quitting: 11.4  . Smokeless tobacco: Never Used  Substance Use Topics  . Alcohol use: Yes    Alcohol/week: 6.0 - 8.0 standard drinks    Types: 3 - 4 Glasses of wine, 3 - 4 Cans of beer per week  . Drug use: No     Family Hx: The patient's family history includes Diabetes in his mother; Heart disease in his mother; Hypertension in his mother; Varicose Veins in his  mother.  ROS:   Please see the history of present illness.     All other systems reviewed and are negative.   Prior CV studies:   The following studies were reviewed today:  TTE 03/13/18  1. The left ventricle has severely reduced systolic function, with an ejection fraction of 20-25%. The cavity size was normal. Left ventricular diastolic Doppler parameters are consistent with pseudonormalization.  2. The right ventricle has normal systolic function. The cavity was normal. There is no increase in right ventricular wall thickness.  3. Left atrial size was mildly dilated.  4. The mitral valve is normal in  structure. Mitral valve regurgitation is mild to moderate by color flow Doppler.  5. The tricuspid valve is normal in structure.  6. The aortic valve is tricuspid Mild calcification of the aortic valve.  7. The pulmonic valve was normal in structure.  8. Severe akinesis of the left ventricular septal.  Labs/Other Tests and Data Reviewed:    EKG:  An ECG dated 03/07/18 was personally reviewed today and demonstrated:  SR, LBBB  Recent Labs: 04/28/2018: BUN 23; Creatinine, Ser 1.13; NT-Pro BNP 6,422; Potassium 5.0; Sodium 136   Recent Lipid Panel No results found for: CHOL, TRIG, HDL, CHOLHDL, LDLCALC, LDLDIRECT  Wt Readings from Last 3 Encounters:  06/13/18 190 lb (86.2 kg)  05/13/18 192 lb (87.1 kg)  04/28/18 198 lb (89.8 kg)     Objective:    Vital Signs:  BP 138/67 (BP Location: Right Leg)   Pulse 75    VITAL SIGNS:  reviewed GEN:  no acute distress PSYCH:  normal affect  ASSESSMENT & PLAN:    1. Chronic systolic heart failure: NYHA class II symptoms.  Currently on optimal medical therapy with Entresto, though beta-blocker has been stopped due to orthostasis.He does have a left bundle branch block and thus would likely benefit from CRT. He is not on a beta-blocker, but this could be simply due to his orthostasis.  I Nakeshia Waldeck discuss this with his primary cardiologist.  Otherwise risks and benefits of ICD implant were discussed and include bleeding, tamponade, pneumothorax, tamponade.  The patient understands these risks and is agreed to the procedure. 2. Coronary artery disease: No current chest pain.  Plan per primary cardiology. 3. Hyperlipidemia: Continue statin per primary cardiology  COVID-19 Education: The signs and symptoms of COVID-19 were discussed with the patient and how to seek care for testing (follow up with PCP or arrange E-visit).  The importance of social distancing was discussed today.  Time:   Today, I have spent 15 minutes with the patient with telehealth  technology discussing the above problems.     Medication Adjustments/Labs and Tests Ordered: Current medicines are reviewed at length with the patient today.  Concerns regarding medicines are outlined above.   Tests Ordered: No orders of the defined types were placed in this encounter.   Medication Changes: No orders of the defined types were placed in this encounter.   Disposition:  Follow up in 3 month(s)  Signed, Hilda Wexler Meredith Leeds, MD  06/16/2018 8:59 AM    [place]

## 2018-06-19 DIAGNOSIS — E785 Hyperlipidemia, unspecified: Secondary | ICD-10-CM | POA: Diagnosis not present

## 2018-06-19 DIAGNOSIS — I447 Left bundle-branch block, unspecified: Secondary | ICD-10-CM | POA: Diagnosis not present

## 2018-06-19 DIAGNOSIS — I255 Ischemic cardiomyopathy: Secondary | ICD-10-CM | POA: Diagnosis not present

## 2018-06-19 DIAGNOSIS — Z95810 Presence of automatic (implantable) cardiac defibrillator: Secondary | ICD-10-CM | POA: Diagnosis not present

## 2018-06-19 DIAGNOSIS — I5022 Chronic systolic (congestive) heart failure: Secondary | ICD-10-CM | POA: Diagnosis not present

## 2018-06-19 DIAGNOSIS — I110 Hypertensive heart disease with heart failure: Secondary | ICD-10-CM | POA: Diagnosis not present

## 2018-06-19 DIAGNOSIS — I25118 Atherosclerotic heart disease of native coronary artery with other forms of angina pectoris: Secondary | ICD-10-CM | POA: Diagnosis not present

## 2018-06-20 ENCOUNTER — Other Ambulatory Visit: Payer: Self-pay | Admitting: Cardiology

## 2018-06-20 LAB — COMPREHENSIVE METABOLIC PANEL
ALT: 17 IU/L (ref 0–44)
AST: 23 IU/L (ref 0–40)
Albumin/Globulin Ratio: 2.4 — ABNORMAL HIGH (ref 1.2–2.2)
Albumin: 4.5 g/dL (ref 3.7–4.7)
Alkaline Phosphatase: 88 IU/L (ref 39–117)
BUN/Creatinine Ratio: 18 (ref 10–24)
BUN: 25 mg/dL (ref 8–27)
Bilirubin Total: 0.7 mg/dL (ref 0.0–1.2)
CO2: 27 mmol/L (ref 20–29)
Calcium: 9.4 mg/dL (ref 8.6–10.2)
Chloride: 98 mmol/L (ref 96–106)
Creatinine, Ser: 1.39 mg/dL — ABNORMAL HIGH (ref 0.76–1.27)
GFR calc Af Amer: 56 mL/min/{1.73_m2} — ABNORMAL LOW (ref >59)
GFR calc non Af Amer: 48 mL/min/{1.73_m2} — ABNORMAL LOW (ref >59)
Globulin, Total: 1.9 g/dL (ref 1.5–4.5)
Glucose: 98 mg/dL (ref 65–99)
Potassium: 4.2 mmol/L (ref 3.5–5.2)
Sodium: 141 mmol/L (ref 134–144)
Total Protein: 6.4 g/dL (ref 6.0–8.5)

## 2018-06-20 LAB — LIPID PANEL
Chol/HDL Ratio: 2.1 ratio (ref 0.0–5.0)
Cholesterol, Total: 175 mg/dL (ref 100–199)
HDL: 82 mg/dL (ref >39)
LDL Calculated: 79 mg/dL (ref 0–99)
Triglycerides: 68 mg/dL (ref 0–149)
VLDL Cholesterol Cal: 14 mg/dL (ref 5–40)

## 2018-06-20 LAB — PRO B NATRIURETIC PEPTIDE: NT-Pro BNP: 3729 pg/mL — ABNORMAL HIGH (ref 0–486)

## 2018-06-23 ENCOUNTER — Telehealth: Payer: Self-pay | Admitting: *Deleted

## 2018-06-23 NOTE — Telephone Encounter (Signed)
Wants to talk to someone about the defibrillater being placed. How soon would that be? Please advise.

## 2018-06-24 DIAGNOSIS — M48062 Spinal stenosis, lumbar region with neurogenic claudication: Secondary | ICD-10-CM | POA: Diagnosis not present

## 2018-06-24 NOTE — Telephone Encounter (Signed)
Pt wants to possibly wait until after his fishing trip in August.  By then, the visitor hospital restrictions may be lifted and his wife may be able to wait in waiting area for procedure.  He does not want to do it if wife can't stay, he isn't all that thrilled to have to come to [facility], but he will for procedure. Pt aware I will f/u this week to decide if he would like next week 6/17 or wait till after fishing trip. Pt appreciates my call and information.

## 2018-06-25 NOTE — Telephone Encounter (Signed)
Pt would like to wait until he gets back from his fishing trip in August before procedure. Pt scheduled for 8/31 in-office visit with Camn[place] to f/u and arrange. Pt agreeable to plan.

## 2018-06-27 DIAGNOSIS — I5022 Chronic systolic (congestive) heart failure: Secondary | ICD-10-CM | POA: Diagnosis not present

## 2018-06-27 DIAGNOSIS — I255 Ischemic cardiomyopathy: Secondary | ICD-10-CM | POA: Diagnosis not present

## 2018-06-27 DIAGNOSIS — E782 Mixed hyperlipidemia: Secondary | ICD-10-CM | POA: Diagnosis not present

## 2018-07-07 ENCOUNTER — Ambulatory Visit: Payer: Medicare Other | Admitting: Cardiology

## 2018-07-21 ENCOUNTER — Other Ambulatory Visit: Payer: Self-pay | Admitting: Cardiology

## 2018-08-14 ENCOUNTER — Other Ambulatory Visit: Payer: Self-pay | Admitting: Cardiology

## 2018-08-16 DIAGNOSIS — L820 Inflamed seborrheic keratosis: Secondary | ICD-10-CM | POA: Diagnosis not present

## 2018-08-16 DIAGNOSIS — D225 Melanocytic nevi of trunk: Secondary | ICD-10-CM | POA: Diagnosis not present

## 2018-08-16 DIAGNOSIS — D1801 Hemangioma of skin and subcutaneous tissue: Secondary | ICD-10-CM | POA: Diagnosis not present

## 2018-09-15 ENCOUNTER — Other Ambulatory Visit: Payer: Self-pay

## 2018-09-15 ENCOUNTER — Ambulatory Visit (INDEPENDENT_AMBULATORY_CARE_PROVIDER_SITE_OTHER): Payer: Medicare Other | Admitting: Cardiology

## 2018-09-15 ENCOUNTER — Encounter: Payer: Self-pay | Admitting: Cardiology

## 2018-09-15 ENCOUNTER — Ambulatory Visit: Payer: Medicare Other | Admitting: Cardiology

## 2018-09-15 VITALS — BP 106/68 | HR 68 | Ht 72.0 in | Wt 199.2 lb

## 2018-09-15 DIAGNOSIS — I255 Ischemic cardiomyopathy: Secondary | ICD-10-CM

## 2018-09-15 DIAGNOSIS — I428 Other cardiomyopathies: Secondary | ICD-10-CM

## 2018-09-15 NOTE — Progress Notes (Signed)
Electrophysiology Office Note   Date:  09/15/2018   ID:  Glenn Sullivan, DOB 1940/03/02, MRN [id number]  PCP:  Glenn Brothers, MD  Cardiologist:  Glenn Sullivan Primary Electrophysiologist:  Glenn Segundo Meredith Leeds, MD    No chief complaint on file.    History of Present Illness: Glenn Sullivan is a 78 y.o. male who is being seen today for the evaluation of CHF at the request of Glenn Brothers, MD. Presenting today for electrophysiology evaluation.  He has a history of coronary artery disease, chronic systolic heart failure, hypertension, left bundle branch block, and PAD.  He is currently on optimal therapy with Entresto and beta-blockers.  Today, he denies symptoms of palpitations, chest pain, shortness of breath, orthopnea, PND, lower extremity edema, claudication, dizziness, presyncope, syncope, bleeding, or neurologic sequela. The patient is tolerating medications without difficulties.  He is currently feeling well.  He has no chest pain.  He does get weak and fatigued at times, but overall is without major complaint.   Past Medical History:  Diagnosis Date  . AAA (abdominal aortic aneurysm) ([place])   . Barrett's esophagus   . Myocardial infarction [facility])    Past Surgical History:  Procedure Laterality Date  . ABDOMINAL AORTIC ANEURYSM REPAIR    . EVAR    . RENAL ARTERY STENT       Current Outpatient Medications  Medication Sig Dispense Refill  . aspirin EC 81 MG tablet Take 81 mg by mouth daily.    Marland Kitchen atorvastatin (LIPITOR) 40 MG tablet Take 1 tablet (40 mg total) by mouth daily. 90 tablet 0  . fluticasone (FLONASE) 50 MCG/ACT nasal spray USE ONE SPRAY IN EACH NOSTRIL ONCE A DAY FOR 90 DAYS    . gabapentin (NEURONTIN) 300 MG capsule Take 300 mg by mouth 3 (three) times daily as needed.    Marland Kitchen omeprazole (PRILOSEC) 20 MG capsule Take 20 mg by mouth daily.    . sacubitril-valsartan (ENTRESTO) 49-51 MG Take 1 tablet by mouth 2 (two) times daily.    Marland Kitchen torsemide (DEMADEX) 20 MG tablet TAKE 1 TABLET  DAILY, EXCEPT 2 TABLETS ON MONDAY, WEDNESDAY, AND FRIDAY 120 tablet 1   No current facility-administered medications for this visit.     Allergies:   Tetanus toxoids   Social History:  The patient  reports that he quit smoking about 11 years ago. His smoking use included cigarettes. He has never used smokeless tobacco. He reports current alcohol use of about 6.0 - 8.0 standard drinks of alcohol per week. He reports that he does not use drugs.   Family History:  The patient's family history includes Diabetes in his mother; Heart disease in his mother; Hypertension in his mother; Varicose Veins in his mother.    ROS:  Please see the history of present illness.   Otherwise, review of systems is positive for none.   All other systems are reviewed and negative.    PHYSICAL EXAM: VS:  BP 106/68   Pulse 68   Ht 6' (1.829 m)   Wt 199 lb 3.2 oz (90.4 kg)   SpO2 98%   BMI 27.02 kg/m  , BMI Body mass index is 27.02 kg/m. GEN: Well nourished, well developed, in no acute distress  HEENT: normal  Neck: no JVD, carotid bruits, or masses Cardiac: RRR; no murmurs, rubs, or gallops,no edema  Respiratory:  clear to auscultation bilaterally, normal work of breathing GI: soft, nontender, nondistended, + BS MS: no deformity or atrophy  Skin: warm and dry Neuro:  Strength and sensation are intact Psych: euthymic mood, full affect  EKG:  EKG is ordered today. Personal review of the ekg ordered shows SR, rate 68, RAD, LBBB  Recent Labs: 06/19/2018: ALT 17; BUN 25; Creatinine, Ser 1.39; NT-Pro BNP 3,729; Potassium 4.2; Sodium 141    Lipid Panel     Component Value Date/Time   CHOL 175 06/19/2018 0804   TRIG 68 06/19/2018 0804   HDL 82 06/19/2018 0804   CHOLHDL 2.1 06/19/2018 0804   LDLCALC 79 06/19/2018 0804     Wt Readings from Last 3 Encounters:  09/15/18 199 lb 3.2 oz (90.4 kg)  06/13/18 190 lb (86.2 kg)  05/13/18 192 lb (87.1 kg)      Other studies Reviewed: Additional studies/  records that were reviewed today include: TTE 03/13/2018 Review of the above records today demonstrates:   1. The left ventricle has severely reduced systolic function, with an ejection fraction of 20-25%. The cavity size was normal. Left ventricular diastolic Doppler parameters are consistent with pseudonormalization.  2. The right ventricle has normal systolic function. The cavity was normal. There is no increase in right ventricular wall thickness.  3. Left atrial size was mildly dilated.  4. The mitral valve is normal in structure. Mitral valve regurgitation is mild to moderate by color flow Doppler.  5. The tricuspid valve is normal in structure.  6. The aortic valve is tricuspid Mild calcification of the aortic valve.  7. The pulmonic valve was normal in structure.  8. Severe akinesis of the left ventricular septal.   ASSESSMENT AND PLAN:  1.  Chronic systolic heart failure due to ischemic cardiomyopathy: Class II symptoms.  Currently on optimal medical therapy with Entresto and beta-blockers.  Would benefit from CRT-D implant.  That being said, he has thought about the options and does not wish to have a defibrillator implanted.  We Glenn Sullivan hold off on defibrillator implant for now.  If he waits for the next few years, he may benefit from CRT P instead of CRT-D.  2.  Coronary artery disease: No current chest pain.  Plan per primary cardiology.  3.  Hyperlipidemia: Statin per primary cardiology.    Current medicines are reviewed at length with the patient today.   The patient does not have concerns regarding his medicines.  The following changes were made today:  none  Labs/ tests ordered today include:  Orders Placed This Encounter  Procedures  . EKG 12-Lead     Disposition:   FU with Glenn Sullivan PRN  Case discussed with primary cardiology  Signed, Glenn Roanhorse Meredith Leeds, MD  09/15/2018 11:49 AM     [facility] [address] [place] [address]  [phone] (office) [phone] (fax)

## 2018-10-08 DIAGNOSIS — E782 Mixed hyperlipidemia: Secondary | ICD-10-CM | POA: Diagnosis not present

## 2018-10-08 DIAGNOSIS — I5022 Chronic systolic (congestive) heart failure: Secondary | ICD-10-CM | POA: Diagnosis not present

## 2018-10-08 DIAGNOSIS — Z23 Encounter for immunization: Secondary | ICD-10-CM | POA: Diagnosis not present

## 2018-10-08 DIAGNOSIS — I255 Ischemic cardiomyopathy: Secondary | ICD-10-CM | POA: Diagnosis not present

## 2018-10-08 DIAGNOSIS — K22719 Barrett's esophagus with dysplasia, unspecified: Secondary | ICD-10-CM | POA: Diagnosis not present

## 2018-11-07 ENCOUNTER — Other Ambulatory Visit: Payer: Self-pay | Admitting: Cardiology

## 2018-11-24 ENCOUNTER — Other Ambulatory Visit: Payer: Self-pay

## 2018-11-24 ENCOUNTER — Ambulatory Visit (INDEPENDENT_AMBULATORY_CARE_PROVIDER_SITE_OTHER): Payer: Medicare Other | Admitting: Cardiology

## 2018-11-24 ENCOUNTER — Encounter: Payer: Self-pay | Admitting: Cardiology

## 2018-11-24 VITALS — BP 104/78 | HR 72 | Ht 72.0 in | Wt 202.2 lb

## 2018-11-24 DIAGNOSIS — I447 Left bundle-branch block, unspecified: Secondary | ICD-10-CM

## 2018-11-24 DIAGNOSIS — E785 Hyperlipidemia, unspecified: Secondary | ICD-10-CM

## 2018-11-24 DIAGNOSIS — I110 Hypertensive heart disease with heart failure: Secondary | ICD-10-CM

## 2018-11-24 DIAGNOSIS — I5022 Chronic systolic (congestive) heart failure: Secondary | ICD-10-CM | POA: Diagnosis not present

## 2018-11-24 LAB — BASIC METABOLIC PANEL
BUN/Creatinine Ratio: 18 (ref 10–24)
BUN: 21 mg/dL (ref 8–27)
CO2: 26 mmol/L (ref 20–29)
Calcium: 9.2 mg/dL (ref 8.6–10.2)
Chloride: 95 mmol/L — ABNORMAL LOW (ref 96–106)
Creatinine, Ser: 1.17 mg/dL (ref 0.76–1.27)
GFR calc Af Amer: 68 mL/min/{1.73_m2} (ref >59)
GFR calc non Af Amer: 59 mL/min/{1.73_m2} — ABNORMAL LOW (ref >59)
Glucose: 93 mg/dL (ref 65–99)
Potassium: 4.7 mmol/L (ref 3.5–5.2)
Sodium: 135 mmol/L (ref 134–144)

## 2018-11-24 LAB — LIPID PANEL
Chol/HDL Ratio: 2.1 ratio (ref 0.0–5.0)
Cholesterol, Total: 165 mg/dL (ref 100–199)
HDL: 77 mg/dL (ref >39)
LDL Chol Calc (NIH): 75 mg/dL (ref 0–99)
Triglycerides: 67 mg/dL (ref 0–149)
VLDL Cholesterol Cal: 13 mg/dL (ref 5–40)

## 2018-11-24 LAB — PRO B NATRIURETIC PEPTIDE: NT-Pro BNP: 2673 pg/mL — ABNORMAL HIGH (ref 0–486)

## 2018-11-24 NOTE — Patient Instructions (Addendum)
Medication Instructions:  Your physician recommends that you continue on your current medications as directed. Please refer to the Current Medication list given to you today.  *If you need a refill on your cardiac medications before your next appointment, please call your pharmacy*  Lab Work: Your physician recommends that you return for lab work today: BMP, ProBNP, lipid panel.  If you have labs (blood work) drawn today and your tests are completely normal, you will receive your results only by: Marland Kitchen MyChart Message (if you have MyChart) OR . A paper copy in the mail If you have any lab test that is abnormal or we need to change your treatment, we will call you to review the results.  Testing/Procedures: None  Follow-Up: At [facility], you and your health needs are our priority.  As part of our continuing mission to provide you with exceptional heart care, we have created designated Provider Care Teams.  These Care Teams include your primary Cardiologist (physician) and Advanced Practice Providers (APPs -  Physician Assistants and Nurse Practitioners) who all work together to provide you with the care you need, when you need it.  Your next appointment:   3 months  The format for your next appointment:   In Person  Provider:   Shirlee More, MD  Other Instructions **Purchase over the counter [facility] medical eye protectors!

## 2018-11-24 NOTE — Progress Notes (Signed)
Cardiology Office Note:    Date:  11/24/2018   ID:  Glenn Sullivan, DOB 1940/07/09, MRN [id number]  PCP:  Garwin Brothers, MD  Cardiologist:  Shirlee More, MD    Referring MD: Garwin Brothers, MD    ASSESSMENT:    No diagnosis found. PLAN:    In order of problems listed above:  1. Heart failure improved compensated New York Heart Association class I continue current treatment his wife tells me he gets systolics at home less than 100 I will not uptitrate his Delene Loll and he has had symptomatic bradycardia and we have tried low-dose beta-blockers in the past.  Recheck renal function today continue his current loop diuretic and Entresto and consider SGLT2 next visit 2. Hyperlipidemia stable continue statin lipids at target in June 3. CAD stable continue medical therapy   Next appointment: 3 months   Medication Adjustments/Labs and Tests Ordered: Current medicines are reviewed at length with the patient today.  Concerns regarding medicines are outlined above.  No orders of the defined types were placed in this encounter.  No orders of the defined types were placed in this encounter.   No chief complaint on file.   History of Present Illness:     Glenn Sullivan is a 78 y.o. male with a hx of CAD cath 2017 with EF  35% mild to moderate diffuse CAD and no target for revascularization, heart failure, hypertension,severe cardiomyopathy, LBBB and PAD seen Dr Geraldo Pitter 03/31/18. He is on a loop diuretic and Entresto. He has declined an ICD after discussion by Dr Geraldo Pitter.He was [facility] at [facility] ED 04/21/18 with decompensated heart failure after his diuretic was stopped due to lightheadedness.     Echo 03/13/18: IMPRESSIONS  1. The left ventricle has severely reduced systolic function, with an ejection fraction of 20-25%. The cavity size was normal. Left ventricular diastolic Doppler parameters are consistent with pseudonormalization.  2. The right ventricle has normal systolic function. The cavity was  normal. There is no increase in right ventricular wall thickness.  3. Left atrial size was mildly dilated.  4. The mitral valve is normal in structure. Mitral valve regurgitation is mild to moderate by color flow Doppler.  5. The tricuspid valve is normal in structure.  6. The aortic valve is tricuspid Mild calcification of the aortic valve.  7. The pulmonic valve was normal in structure.  8. Severe akinesis of the left ventricular septal.  last seen 06/13/2018. Compliance with diet, lifestyle and medications: Yes   Ref Range & Units 84mo ago 48mo ago  NT-Pro BNP 0 - 486 pg/mL 3,729High   6,422High  CM    He is doing well his wife said he is no longer short of breath with activity New York Heart Association class I no edema orthopnea chest pain palpitation or syncope.  Weight is stable at home and he sodium restricts uses a pillbox for medications.  he has made decision not to have an ICD and I have asked his wife to contact us he has a syncopal episode because I would place a LifeVest on him at that time Past Medical History:  Diagnosis Date  . AAA (abdominal aortic aneurysm) ([place])   . Barrett's esophagus   . Myocardial infarction [facility])     Past Surgical History:  Procedure Laterality Date  . ABDOMINAL AORTIC ANEURYSM REPAIR    . EVAR    . RENAL ARTERY STENT      Current Medications: No outpatient medications have been marked as taking for  the 11/24/18 encounter (Appointment) with Richardo Priest, MD.     Allergies:   Tetanus toxoids   Social History   Socioeconomic History  . Marital status: Married    Spouse name: Not on file  . Number of children: Not on file  . Years of education: Not on file  . Highest education level: Not on file  Occupational History  . Not on file  Social Needs  . Financial resource strain: Not on file  . Food insecurity    Worry: Not on file    Inability: Not on file  . Transportation needs    Medical: Not on file    Non-medical: Not on file   Tobacco Use  . Smoking status: Former Smoker    Types: Cigarettes    Quit date: 01/16/2007    Years since quitting: 11.8  . Smokeless tobacco: Never Used  Substance and Sexual Activity  . Alcohol use: Yes    Alcohol/week: 6.0 - 8.0 standard drinks    Types: 3 - 4 Glasses of wine, 3 - 4 Cans of beer per week  . Drug use: No  . Sexual activity: Not on file  Lifestyle  . Physical activity    Days per week: Not on file    Minutes per session: Not on file  . Stress: Not on file  Relationships  . Social Herbalist on phone: Not on file    Gets together: Not on file    Attends religious service: Not on file    Active member of club or organization: Not on file    Attends meetings of clubs or organizations: Not on file    Relationship status: Not on file  Other Topics Concern  . Not on file  Social History Narrative  . Not on file     Family History: The patient's family history includes Diabetes in his mother; Heart disease in his mother; Hypertension in his mother; Varicose Veins in his mother. ROS:   Please see the history of present illness.    All other systems reviewed and are negative.  EKGs/Labs/Other Studies Reviewed:    The following studies were reviewed today  Recent Labs: 06/19/2018: ALT 17; BUN 25; Creatinine, Ser 1.39; NT-Pro BNP 3,729; Potassium 4.2; Sodium 141  Recent Lipid Panel    Component Value Date/Time   CHOL 175 06/19/2018 0804   TRIG 68 06/19/2018 0804   HDL 82 06/19/2018 0804   CHOLHDL 2.1 06/19/2018 0804   LDLCALC 79 06/19/2018 0804    Physical Exam:    VS:  There were no vitals taken for this visit.    Wt Readings from Last 3 Encounters:  09/15/18 199 lb 3.2 oz (90.4 kg)  06/13/18 190 lb (86.2 kg)  05/13/18 192 lb (87.1 kg)     GEN:  Well nourished, well developed in no acute distress HEENT: Normal NECK: No JVD; No carotid bruits LYMPHATICS: No lymphadenopathy CARDIAC: RRR, no murmurs, rubs, gallops RESPIRATORY:  Clear to  auscultation without rales, wheezing or rhonchi  ABDOMEN: Soft, non-tender, non-distended MUSCULOSKELETAL:  No edema; No deformity  SKIN: Warm and dry NEUROLOGIC:  Alert and oriented x 3 PSYCHIATRIC:  Normal affect    Signed, Shirlee More, MD  11/24/2018 7:26 AM    [place]

## 2018-12-04 ENCOUNTER — Telehealth: Payer: Self-pay

## 2018-12-04 NOTE — Telephone Encounter (Signed)
Patient called to office to check the status of his lab results.  Dr [name] reviewed lab results and patient was advised that labs look stable and normal.  Patient agreed to plan and verbalized understanding.  No further questions.

## 2018-12-09 ENCOUNTER — Other Ambulatory Visit: Payer: Self-pay

## 2018-12-23 DIAGNOSIS — M48062 Spinal stenosis, lumbar region with neurogenic claudication: Secondary | ICD-10-CM | POA: Diagnosis not present

## 2019-02-01 ENCOUNTER — Other Ambulatory Visit: Payer: Self-pay | Admitting: Cardiology

## 2019-03-02 ENCOUNTER — Ambulatory Visit: Payer: Medicare Other | Admitting: Cardiology

## 2019-03-07 NOTE — Progress Notes (Signed)
Cardiology Office Note:    Date:  03/09/2019   ID:  Glenn Sullivan, DOB 21-Sep-1940, MRN [id number]  PCP:  Garwin Brothers, MD  Cardiologist:  Shirlee More, MD    Referring MD: Garwin Brothers, MD    ASSESSMENT:    1. Chronic systolic heart failure ([place])   2. LBBB (left bundle branch block)   3. Hypertensive heart disease with heart failure ([place])   4. Coronary artery disease of native artery of native heart with stable angina pectoris ([place])   5. Dyslipidemia   6. Ischemic cardiomyopathy    PLAN:    In order of problems listed above:  1. His heart failure is compensated continue his current loop diuretic recheck echocardiogram if EF remains severely reduced need to reconsider the merits of ICD and CRT therapy he has been seen by electrophysiology.  Continue guideline directed therapy including diuretic and Entresto.  He has been intolerant of beta-blockers bradycardia at higher dose vasodilators with hypotension 2. Very wide QRS on EKG if ICD is needed would benefit from CRT syncope 3. Blood pressure at target continue current treatment 4. Stable CAD continue aspirin lipid-lowering therapy 5. Stable lipids are ideal 6. Check echocardiogram   Next appointment: 6 months   Medication Adjustments/Labs and Tests Ordered: Current medicines are reviewed at length with the patient today.  Concerns regarding medicines are outlined above.  Orders Placed This Encounter  Procedures  . ECHOCARDIOGRAM COMPLETE   No orders of the defined types were placed in this encounter.   No chief complaint on file.   History of Present Illness:    Glenn Sullivan is a 79 y.o. male with a hx of CAD cath 2017 with EF  35% mild to moderate diffuse CAD and no target for revascularization, heart failure, hypertension,severe cardiomyopathy, LBBB and PAD  last seen 11/24/2018. Compliance with diet, lifestyle and medications: Yes  I reviewed his labs with him his subsequent improvement BNP level although he has  severe LV dysfunction and need to repeat echocardiogram.  He tolerates his current guideline directed therapy will not uptitrate Entresto because of hypotension.  He is pleased with the quality is likely short of breath only when he walks a very long distance in our [employer] has no edema orthopnea chest pain palpitations syncope and has had no recurrent orthostatic hypotension on his medications.  Most recent cholesterol profile 11/24/2018 cholesterol 165 HDL 77 LDL at target 79.   Ref Range & Units 3 mo ago 8 mo ago 10 mo ago  NT-Pro BNP 0 - 486 pg/mL 2,673High   3,729High  CM  6,422High  CM     Ref Range & Units 3 mo ago 8 mo ago 10 mo ago  Glucose 65 - 99 mg/dL 93  98  78   BUN 8 - 27 mg/dL 21  25  23    Creatinine, Ser 0.76 - 1.27 mg/dL 1.17  1.39High   1.13   GFR calc non Af Amer >59 mL/min/1.73 59Low   48Low   62   GFR calc Af Amer >59 mL/min/1.73 68  56Low   72   BUN/Creatinine Ratio 10 - 24 18  18  20    Sodium 134 - 144 mmol/L 135  141  136   Potassium 3.5 - 5.2 mmol/L 4.7  4.2  5.0    Echo 03/13/18:IMPRESSIONS 1. The left ventricle has severely reduced systolic function, with an ejection fraction of 20-25%  Past Medical History:  Diagnosis Date  . AAA (abdominal aortic aneurysm) (  [place])   . Barrett's esophagus   . Myocardial infarction [facility])     Past Surgical History:  Procedure Laterality Date  . ABDOMINAL AORTIC ANEURYSM REPAIR    . EVAR    . RENAL ARTERY STENT      Current Medications: Current Meds  Medication Sig  . aspirin EC 81 MG tablet Take 81 mg by mouth daily.  Marland Kitchen atorvastatin (LIPITOR) 40 MG tablet Take 1 tablet (40 mg total) by mouth daily.  . fluticasone (FLONASE) 50 MCG/ACT nasal spray USE ONE SPRAY IN EACH NOSTRIL ONCE A DAY FOR 90 DAYS  . gabapentin (NEURONTIN) 300 MG capsule Take 300 mg by mouth at bedtime.  Marland Kitchen omeprazole (PRILOSEC) 20 MG capsule Take 20 mg by mouth daily.  . sacubitril-valsartan (ENTRESTO) 49-51 MG Take 1 tablet by mouth 2 (two) times  daily.  Marland Kitchen torsemide (DEMADEX) 20 MG tablet TAKE 1 TABLET DAILY, EXCEPT 2 TABLETS ON MONDAY, WEDNESDAY, AND FRIDAY  . [DISCONTINUED] gabapentin (NEURONTIN) 300 MG capsule Take 300 mg by mouth 3 (three) times daily as needed.     Allergies:   Tetanus toxoids   Social History   Socioeconomic History  . Marital status: Married    Spouse name: Not on file  . Number of children: Not on file  . Years of education: Not on file  . Highest education level: Not on file  Occupational History  . Not on file  Tobacco Use  . Smoking status: Former Smoker    Types: Cigarettes    Quit date: 01/16/2007    Years since quitting: 12.1  . Smokeless tobacco: Never Used  Substance and Sexual Activity  . Alcohol use: Yes    Comment: occ  . Drug use: No  . Sexual activity: Not on file  Other Topics Concern  . Not on file  Social History Narrative  . Not on file   Social Determinants of Health   Financial Resource Strain:   . Difficulty of Paying Living Expenses: Not on file  Food Insecurity:   . Worried About Charity fundraiser in the Last Year: Not on file  . Ran Out of Food in the Last Year: Not on file  Transportation Needs:   . Lack of Transportation (Medical): Not on file  . Lack of Transportation (Non-Medical): Not on file  Physical Activity:   . Days of Exercise per Week: Not on file  . Minutes of Exercise per Session: Not on file  Stress:   . Feeling of Stress : Not on file  Social Connections:   . Frequency of Communication with Friends and Family: Not on file  . Frequency of Social Gatherings with Friends and Family: Not on file  . Attends Religious Services: Not on file  . Active Member of Clubs or Organizations: Not on file  . Attends Archivist Meetings: Not on file  . Marital Status: Not on file     Family History: The patient's family history includes Diabetes in his mother; Heart disease in his mother; Hypertension in his mother; Varicose Veins in his  mother. ROS:   Please see the history of present illness.    All other systems reviewed and are negative.  EKGs/Labs/Other Studies Reviewed:    The following studies were reviewed today:  EKG 08/28/2018 dependently reviewed sinus rhythm left bundle branch block.  Wide QRS 190 msecs Recent Labs: 06/19/2018: ALT 17 11/24/2018: BUN 21; Creatinine, Ser 1.17; NT-Pro BNP 2,673; Potassium 4.7; Sodium 135  Recent Lipid  Panel    Component Value Date/Time   CHOL 165 11/24/2018 0914   TRIG 67 11/24/2018 0914   HDL 77 11/24/2018 0914   CHOLHDL 2.1 11/24/2018 0914   LDLCALC 75 11/24/2018 0914    Physical Exam:    VS:  BP 108/66   Pulse (!) 49   Temp (!) 97 F (36.1 C)   Ht 6' (1.829 m)   Wt 204 lb 12.8 oz (92.9 kg)   SpO2 95%   BMI 27.78 kg/m     Wt Readings from Last 3 Encounters:  03/09/19 204 lb 12.8 oz (92.9 kg)  11/24/18 202 lb 3.2 oz (91.7 kg)  09/15/18 199 lb 3.2 oz (90.4 kg)     GEN:  Well nourished, well developed in no acute distress HEENT: Normal NECK: No JVD; No carotid bruits LYMPHATICS: No lymphadenopathy CARDIAC: RRR, no murmurs, rubs, gallops RESPIRATORY:  Clear to auscultation without rales, wheezing or rhonchi  ABDOMEN: Soft, non-tender, non-distended MUSCULOSKELETAL:  No edema; No deformity  SKIN: Warm and dry NEUROLOGIC:  Alert and oriented x 3 PSYCHIATRIC:  Normal affect    Signed, Shirlee More, MD  03/09/2019 9:05 AM    [place]

## 2019-03-09 ENCOUNTER — Other Ambulatory Visit: Payer: Self-pay

## 2019-03-09 ENCOUNTER — Ambulatory Visit (INDEPENDENT_AMBULATORY_CARE_PROVIDER_SITE_OTHER): Payer: Medicare Other | Admitting: Cardiology

## 2019-03-09 ENCOUNTER — Encounter: Payer: Self-pay | Admitting: Cardiology

## 2019-03-09 VITALS — BP 108/66 | HR 49 | Temp 97.0°F | Ht 72.0 in | Wt 204.8 lb

## 2019-03-09 DIAGNOSIS — I255 Ischemic cardiomyopathy: Secondary | ICD-10-CM

## 2019-03-09 DIAGNOSIS — I110 Hypertensive heart disease with heart failure: Secondary | ICD-10-CM

## 2019-03-09 DIAGNOSIS — I25118 Atherosclerotic heart disease of native coronary artery with other forms of angina pectoris: Secondary | ICD-10-CM

## 2019-03-09 DIAGNOSIS — I5022 Chronic systolic (congestive) heart failure: Secondary | ICD-10-CM | POA: Diagnosis not present

## 2019-03-09 DIAGNOSIS — I447 Left bundle-branch block, unspecified: Secondary | ICD-10-CM | POA: Diagnosis not present

## 2019-03-09 DIAGNOSIS — E785 Hyperlipidemia, unspecified: Secondary | ICD-10-CM

## 2019-03-09 NOTE — Patient Instructions (Addendum)
Medication Instructions:  Your physician recommends that you continue on your current medications as directed. Please refer to the Current Medication list given to you today.  *If you need a refill on your cardiac medications before your next appointment, please call your pharmacy*  Lab Work: None ordered  If you have labs (blood work) drawn today and your tests are completely normal, you will receive your results only by: Marland Kitchen MyChart Message (if you have MyChart) OR . A paper copy in the mail If you have any lab test that is abnormal or we need to change your treatment, we will call you to review the results.  Testing/Procedures: Your physician has requested that you have an echocardiogram. Echocardiography is a painless test that uses sound waves to create images of your heart. It provides your doctor with information about the size and shape of your heart and how well your heart's chambers and valves are working. This procedure takes approximately one hour. There are no restrictions for this procedure.    Follow-Up: At [facility], you and your health needs are our priority.  As part of our continuing mission to provide you with exceptional heart care, we have created designated Provider Care Teams.  These Care Teams include your primary Cardiologist (physician) and Advanced Practice Providers (APPs -  Physician Assistants and Nurse Practitioners) who all work together to provide you with the care you need, when you need it.  Your next appointment:   6 month(s)  The format for your next appointment:   In Person  Provider:   Shirlee More, MD  Other Instructions  Echocardiogram An echocardiogram is a procedure that uses painless sound waves (ultrasound) to produce an image of the heart. Images from an echocardiogram can provide important information about:  Signs of coronary artery disease (CAD).  Aneurysm detection. An aneurysm is a weak or damaged part of an artery wall that  bulges out from the normal force of blood pumping through the body.  Heart size and shape. Changes in the size or shape of the heart can be associated with certain conditions, including heart failure, aneurysm, and CAD.  Heart muscle function.  Heart valve function.  Signs of a past heart attack.  Fluid buildup around the heart.  Thickening of the heart muscle.  A tumor or infectious growth around the heart valves. Tell a health care provider about:  Any allergies you have.  All medicines you are taking, including vitamins, herbs, eye drops, creams, and over-the-counter medicines.  Any blood disorders you have.  Any surgeries you have had.  Any medical conditions you have.  Whether you are pregnant or may be pregnant. What are the risks? Generally, this is a safe procedure. However, problems may occur, including:  Allergic reaction to dye (contrast) that may be used during the procedure. What happens before the procedure? No specific preparation is needed. You may eat and drink normally. What happens during the procedure?   An IV tube may be inserted into one of your veins.  You may receive contrast through this tube. A contrast is an injection that improves the quality of the pictures from your heart.  A gel will be applied to your chest.  A wand-like tool (transducer) will be moved over your chest. The gel will help to transmit the sound waves from the transducer.  The sound waves will harmlessly bounce off of your heart to allow the heart images to be captured in real-time motion. The images will be recorded  on a computer. The procedure may vary among health care providers and hospitals. What happens after the procedure?  You may return to your normal, everyday life, including diet, activities, and medicines, unless your health care provider tells you not to do that. Summary  An echocardiogram is a procedure that uses painless sound waves (ultrasound) to produce  an image of the heart.  Images from an echocardiogram can provide important information about the size and shape of your heart, heart muscle function, heart valve function, and fluid buildup around your heart.  You do not need to do anything to prepare before this procedure. You may eat and drink normally.  After the echocardiogram is completed, you may return to your normal, everyday life, unless your health care provider tells you not to do that. This information is not intended to replace advice given to you by your health care provider. Make sure you discuss any questions you have with your health care provider. Document Revised: 04/24/2018 Document Reviewed: 02/04/2016 Elsevier Patient Education  [place].

## 2019-04-23 ENCOUNTER — Other Ambulatory Visit: Payer: Self-pay

## 2019-04-23 ENCOUNTER — Ambulatory Visit (INDEPENDENT_AMBULATORY_CARE_PROVIDER_SITE_OTHER): Payer: Medicare Other

## 2019-04-23 DIAGNOSIS — I255 Ischemic cardiomyopathy: Secondary | ICD-10-CM | POA: Diagnosis not present

## 2019-04-23 NOTE — Progress Notes (Signed)
Complete echocardiogram performed.  Xaviar Lonnetta Kniskern RDCS, RVT  

## 2019-05-18 DIAGNOSIS — J029 Acute pharyngitis, unspecified: Secondary | ICD-10-CM | POA: Diagnosis not present

## 2019-05-18 DIAGNOSIS — H9202 Otalgia, left ear: Secondary | ICD-10-CM | POA: Diagnosis not present

## 2019-05-25 DIAGNOSIS — H6122 Impacted cerumen, left ear: Secondary | ICD-10-CM | POA: Diagnosis not present

## 2019-05-25 DIAGNOSIS — J029 Acute pharyngitis, unspecified: Secondary | ICD-10-CM | POA: Diagnosis not present

## 2019-07-07 ENCOUNTER — Other Ambulatory Visit: Payer: Self-pay | Admitting: Cardiology

## 2019-12-20 ENCOUNTER — Other Ambulatory Visit: Payer: Self-pay | Admitting: Cardiology

## 2020-01-26 DIAGNOSIS — M48062 Spinal stenosis, lumbar region with neurogenic claudication: Secondary | ICD-10-CM | POA: Diagnosis not present

## 2020-02-15 DIAGNOSIS — K2270 Barrett's esophagus without dysplasia: Secondary | ICD-10-CM | POA: Diagnosis not present

## 2020-02-15 DIAGNOSIS — K219 Gastro-esophageal reflux disease without esophagitis: Secondary | ICD-10-CM | POA: Diagnosis not present

## 2020-02-18 DIAGNOSIS — Z1159 Encounter for screening for other viral diseases: Secondary | ICD-10-CM | POA: Diagnosis not present

## 2020-02-18 DIAGNOSIS — Z20828 Contact with and (suspected) exposure to other viral communicable diseases: Secondary | ICD-10-CM | POA: Diagnosis not present

## 2020-02-22 DIAGNOSIS — I25118 Atherosclerotic heart disease of native coronary artery with other forms of angina pectoris: Secondary | ICD-10-CM | POA: Diagnosis not present

## 2020-02-22 DIAGNOSIS — G8929 Other chronic pain: Secondary | ICD-10-CM | POA: Diagnosis not present

## 2020-02-22 DIAGNOSIS — M545 Low back pain, unspecified: Secondary | ICD-10-CM | POA: Diagnosis not present

## 2020-02-22 DIAGNOSIS — E782 Mixed hyperlipidemia: Secondary | ICD-10-CM | POA: Diagnosis not present

## 2020-02-25 DIAGNOSIS — K449 Diaphragmatic hernia without obstruction or gangrene: Secondary | ICD-10-CM | POA: Diagnosis not present

## 2020-02-25 DIAGNOSIS — I10 Essential (primary) hypertension: Secondary | ICD-10-CM | POA: Diagnosis not present

## 2020-02-25 DIAGNOSIS — K2270 Barrett's esophagus without dysplasia: Secondary | ICD-10-CM | POA: Diagnosis not present

## 2020-02-25 DIAGNOSIS — K229 Disease of esophagus, unspecified: Secondary | ICD-10-CM | POA: Diagnosis not present

## 2020-02-25 DIAGNOSIS — K222 Esophageal obstruction: Secondary | ICD-10-CM | POA: Diagnosis not present

## 2020-02-25 DIAGNOSIS — K219 Gastro-esophageal reflux disease without esophagitis: Secondary | ICD-10-CM | POA: Diagnosis not present

## 2020-04-26 ENCOUNTER — Encounter: Payer: Self-pay | Admitting: Gastroenterology

## 2020-04-27 ENCOUNTER — Telehealth: Payer: Self-pay | Admitting: Cardiology

## 2020-04-27 NOTE — Telephone Encounter (Signed)
Pt c/o increased shortness of breath and chest tightness for over a month. Pt does not weigh himself daily and states he has no edema in his legs. Pt has an appointment with Dr.Tobb as Dr. [name] does not have any opening until June. Pt advised to go to the ED for increased pain, diaphoresis, nausea/ vomiting. Pt verbalized understanding and had no additional questions.

## 2020-04-27 NOTE — Telephone Encounter (Signed)
Pt c/o Shortness Of Breath: STAT if SOB developed within the last 24 hours or pt is noticeably SOB on the phone  1. Are you currently SOB (can you hear that pt is SOB on the phone)? no  2. How long have you been experiencing SOB? A month  3. Are you SOB when sitting or when up moving around? Moving around  4. Are you currently experiencing any other symptoms? no  Patient states he has been SOB for a month. He states it does not bother him when he is sitting. He states when he just walks to get the mail he gets SOB and pants. He states last time he had water around his heart and lungs and went to the hospital. He states he takes all his medications like clock work. Please advise.

## 2020-05-09 DIAGNOSIS — I714 Abdominal aortic aneurysm, without rupture, unspecified: Secondary | ICD-10-CM | POA: Insufficient documentation

## 2020-05-09 DIAGNOSIS — I219 Acute myocardial infarction, unspecified: Secondary | ICD-10-CM | POA: Insufficient documentation

## 2020-05-10 DIAGNOSIS — M25569 Pain in unspecified knee: Secondary | ICD-10-CM | POA: Insufficient documentation

## 2020-05-10 DIAGNOSIS — Z23 Encounter for immunization: Secondary | ICD-10-CM | POA: Insufficient documentation

## 2020-05-10 DIAGNOSIS — F528 Other sexual dysfunction not due to a substance or known physiological condition: Secondary | ICD-10-CM | POA: Insufficient documentation

## 2020-05-10 DIAGNOSIS — D126 Benign neoplasm of colon, unspecified: Secondary | ICD-10-CM | POA: Insufficient documentation

## 2020-05-10 DIAGNOSIS — Z86010 Personal history of colonic polyps: Secondary | ICD-10-CM | POA: Insufficient documentation

## 2020-05-10 DIAGNOSIS — H9190 Unspecified hearing loss, unspecified ear: Secondary | ICD-10-CM | POA: Insufficient documentation

## 2020-05-10 DIAGNOSIS — I739 Peripheral vascular disease, unspecified: Secondary | ICD-10-CM | POA: Insufficient documentation

## 2020-05-10 DIAGNOSIS — E785 Hyperlipidemia, unspecified: Secondary | ICD-10-CM | POA: Insufficient documentation

## 2020-05-11 ENCOUNTER — Encounter: Payer: Self-pay | Admitting: Cardiology

## 2020-05-11 ENCOUNTER — Other Ambulatory Visit: Payer: Self-pay

## 2020-05-11 ENCOUNTER — Ambulatory Visit (INDEPENDENT_AMBULATORY_CARE_PROVIDER_SITE_OTHER): Payer: Medicare Other | Admitting: Cardiology

## 2020-05-11 VITALS — BP 124/62 | HR 81 | Ht 72.0 in | Wt 205.8 lb

## 2020-05-11 DIAGNOSIS — I110 Hypertensive heart disease with heart failure: Secondary | ICD-10-CM

## 2020-05-11 DIAGNOSIS — I447 Left bundle-branch block, unspecified: Secondary | ICD-10-CM

## 2020-05-11 DIAGNOSIS — E785 Hyperlipidemia, unspecified: Secondary | ICD-10-CM | POA: Diagnosis not present

## 2020-05-11 DIAGNOSIS — I5022 Chronic systolic (congestive) heart failure: Secondary | ICD-10-CM

## 2020-05-11 DIAGNOSIS — I420 Dilated cardiomyopathy: Secondary | ICD-10-CM | POA: Diagnosis not present

## 2020-05-11 DIAGNOSIS — R0600 Dyspnea, unspecified: Secondary | ICD-10-CM | POA: Diagnosis not present

## 2020-05-11 DIAGNOSIS — R0609 Other forms of dyspnea: Secondary | ICD-10-CM

## 2020-05-11 DIAGNOSIS — R0602 Shortness of breath: Secondary | ICD-10-CM

## 2020-05-11 DIAGNOSIS — I2510 Atherosclerotic heart disease of native coronary artery without angina pectoris: Secondary | ICD-10-CM

## 2020-05-11 NOTE — H&P (View-Only) (Signed)
Cardiology Office Note:    Date:  05/11/2020   ID:  Glenn Sullivan, DOB 08-23-1940, MRN [id number]  PCP:  Eloisa Northern, MD  Cardiologist:  Norman Herrlich, MD  Electrophysiologist:  None   Referring MD: Eloisa Northern, MD   Home having significant worsening shortness of breath.  History of Present Illness:    Glenn Sullivan is a 80 y.o. male with a hx of coronary artery disease had a heart catheterization 2017 with mild to moderate diffuse CAD,heart failure with reduced ejection fraction EF 35%, hypertension, hyperlipidemia, severe cardiomyopathy, peripheral artery disease  The patient is followed by Dr. Dulce Sellar and was last seen by him in February 2021 at that time he appeared to be compensated from a heart failure standpoint.  During that visit he did discuss possibility of CRT-D and continue his guideline medical therapy.  The patient requested to be seen today due to shortness of breath.  The patient is wife is here today they saw me that he is experiencing significant shortness of breath on exertion.  Minimal exertion like walking up walking few steps makes him feel short of breath that he has to stop.  Even with his ADLs he is getting short of breath.   Past Medical History:  Diagnosis Date  . AAA (abdominal aortic aneurysm) (HCC)   . AAA (abdominal aortic aneurysm) without rupture (HCC) 12/24/2013  . Barrett's esophagus   . Cardiomyopathy (HCC) 03/07/2018   EF 35% in 2017  . Chronic systolic heart failure (HCC) 04/25/2018  . Coronary artery disease 03/07/2018   Left heart cath 2017:Conclusions Peripheral Procedure Description Patent stent graft in abdominal aorta Patent Renal artery stents Diagnostic Procedure Summary Diffuse Mild to Moderate calcific coronary artery disease. IFR in the RCA was 1.0 after hyperemic response with adenosine. Moderate global LV systolic dysfunction. LV ejection fraction is 35% Diagnostic Procedure Recommendations Medical the  . Diarrhea 04/28/2018  . Dyslipidemia  10/28/2015  . H/O aortic aneurysm repair 10/28/2015  . Hypertensive heart disease with heart failure (HCC) 03/07/2018  . LBBB (left bundle branch block) 04/25/2018   QRSD 180 msec  . Myocardial infarction [facility])     Past Surgical History:  Procedure Laterality Date  . ABDOMINAL AORTIC ANEURYSM REPAIR    . EVAR    . RENAL ARTERY STENT      Current Medications: Current Meds  Medication Sig  . aspirin EC 81 MG tablet Take 81 mg by mouth daily.  Marland Kitchen atorvastatin (LIPITOR) 40 MG tablet Take 1 tablet (40 mg total) by mouth daily.  . fluticasone (FLONASE) 50 MCG/ACT nasal spray USE ONE SPRAY IN EACH NOSTRIL ONCE A DAY FOR 90 DAYS  . gabapentin (NEURONTIN) 300 MG capsule Take 300 mg by mouth at bedtime.  Marland Kitchen omeprazole (PRILOSEC) 20 MG capsule Take 20 mg by mouth daily.  . sacubitril-valsartan (ENTRESTO) 49-51 MG Take 1 tablet by mouth 2 (two) times daily.  Marland Kitchen torsemide (DEMADEX) 20 MG tablet TAKE 1 TABLET BY MOUTH DAILY, EXCEPT 2 TABLETS ON MONDAY, WEDNESDAY, AND FRIDAY     Allergies:   Tetanus toxoids, Lisinopril, and Tetanus toxoid   Social History   Socioeconomic History  . Marital status: Married    Spouse name: Not on file  . Number of children: Not on file  . Years of education: Not on file  . Highest education level: Not on file  Occupational History  . Not on file  Tobacco Use  . Smoking status: Former Smoker    Types: Cigarettes  Quit date: 01/16/2007    Years since quitting: 13.3  . Smokeless tobacco: Never Used  Vaping Use  . Vaping Use: Never used  Substance and Sexual Activity  . Alcohol use: Yes    Comment: occ  . Drug use: No  . Sexual activity: Not on file  Other Topics Concern  . Not on file  Social History Narrative  . Not on file   Social Determinants of Health   Financial Resource Strain: Not on file  Food Insecurity: Not on file  Transportation Needs: Not on file  Physical Activity: Not on file  Stress: Not on file  Social Connections: Not on file      Family History: The patient's family history includes Diabetes in his mother; Heart disease in his mother; Hypertension in his mother; Varicose Veins in his mother.  ROS:   Review of Systems  Constitution: Negative for decreased appetite, fever and weight gain.  HENT: Negative for congestion, ear discharge, hoarse voice and sore throat.   Eyes: Negative for discharge, redness, vision loss in right eye and visual halos.  Cardiovascular: Negative for chest pain, dyspnea on exertion, leg swelling, orthopnea and palpitations.  Respiratory: Negative for cough, hemoptysis, shortness of breath and snoring.   Endocrine: Negative for heat intolerance and polyphagia.  Hematologic/Lymphatic: Negative for bleeding problem. Does not bruise/bleed easily.  Skin: Negative for flushing, nail changes, rash and suspicious lesions.  Musculoskeletal: Negative for arthritis, joint pain, muscle cramps, myalgias, neck pain and stiffness.  Gastrointestinal: Negative for abdominal pain, bowel incontinence, diarrhea and excessive appetite.  Genitourinary: Negative for decreased libido, genital sores and incomplete emptying.  Neurological: Negative for brief paralysis, focal weakness, headaches and loss of balance.  Psychiatric/Behavioral: Negative for altered mental status, depression and suicidal ideas.  Allergic/Immunologic: Negative for HIV exposure and persistent infections.    EKGs/Labs/Other Studies Reviewed:    The following studies were reviewed today:   EKG:  The ekg ordered today demonstrates sinus rhythm, heart rate 81 bpm with first-degree AV block and underlying left bundle branch block.  Echocardiogram April 23, 2019 shows IMPRESSIONS  1. Left ventricular ejection fraction, by estimation, is 20 to 25%. The  left ventricle has severely decreased function. The left ventricle  demonstrates regional wall motion abnormalities (see scoring  diagram/findings for description). The left   ventricular internal cavity size was severely dilated. Left ventricular  diastolic parameters are consistent with Grade I diastolic dysfunction  (impaired relaxation).  2. Right ventricular systolic function is normal. The right ventricular  size is normal. There is normal pulmonary artery systolic pressure.  3. Left atrial size was severely dilated.  4. The mitral valve is normal in structure. Mild mitral valve  regurgitation. No evidence of mitral stenosis.  5. The aortic valve is normal in structure. Aortic valve regurgitation is  trivial. No aortic stenosis is present.  6. There is mild to moderate dilatation of the ascending aorta measuring  41 mm.  7. The inferior vena cava is normal in size with greater than 50%  respiratory variability, suggesting right atrial pressure of 3 mmHg.    Left heart catheterization in 2017 Procedure Type  Diagnostic procedure: Left Heart Cath , Selective Coronaries, Left  Ventricular Injection  Peripheral Cath Diagnostic Procedure:  FFR:  Complications: No Complications.  Conclusions  Peripheral Procedure Description  Patent stent graft in abdominal aorta  Patent Renal artery stents  Diagnostic Procedure Summary  Diffuse Mild to Moderate calcific coronary artery disease.  IFR in the RCA was  1.0 after hyperemic response with adenosine.  Moderate global LV systolic dysfunction. LV ejection fraction is 35%  Diagnostic Procedure Recommendations  Medical therapy for CAD, LV dysfunction  Medical Therapy because no target lesion for intervention is seen.  Will DC amlodipine, start Coreg for CAD & LV dysfunction  I have reviewed the recent history and physical documentation. I personally  spent 27 minutes continuously monitoring the patient during theadministration  of moderate sedation. Pre and post activities have been reviewed. I was  present for the entire procedure.  Signatures  Electronically signed by Clarene Critchley, MD,  FACC(Diagnostic  Physician) on 11/04/2015 17:03  Angiographic findings  Cardiac Arteries and Lesion Findings  LMCA:  Lesion on LMCA: Distal subsection.40% stenosis 13 mm length . The lesion  was eccentric and heavily calcified.  LAD:  Lesion on Prox LAD: 35% stenosis 11 mm length . The lesion was heavily  calcified.  Lesion on 1st Diag: 15% stenosis 6 mm length .  LCx:  Lesion on 1st Ob Marg: Ostial.20% stenosis 8 mm length .  RCA:  Lesion on Prox RCA: Proximal subsection.50% stenosis 5 mm length . The  lesion was eccentric and heavily calcified.  FFR  +----+-----------------+------------+  !FFR !Stage/Medication !Dosage(mcg) !  +----+-----------------+------------+  Lesion on Dist RCA: 30% stenosis 9 mm length . The lesion was lightly  calcified.  Ramus:  Lesion on Ramus: 10% stenosis 11mm length . Poor run off was present.  Peripheral Arteries and Lesion Findings  Renal, Left: < 50% ostial narrowing  Renal, Right: 0%.  Aorta: Patent Stent graft, no stenosis or leak evident  Procedure Data  Procedure Date  Date: 11/04/2015 Start: 09:10  Entry Locations  - Retrograde Percutaneous access was performed through the Right Radial   artery. A 5 Fr sheath was inserted.  Contrast Material  - Omnipaque150 ml  Fluoroscopy Time: Diagnostic: 8:00 minutes. PCI: 0:00 minutes. Total: 8:00  minutes.  Admission Data  Admission Date: 11/04/2015  Coronary Tree  Dominance: Right  VA  LV function assessed [id number].  Ejection Fraction  - Method: Estimated. EF%: 35.    Recent Labs: No results found for requested labs within last 8760 hours.  Recent Lipid Panel    Component Value Date/Time   CHOL 165 11/24/2018 0914   TRIG 67 11/24/2018 0914   HDL 77 11/24/2018 0914   CHOLHDL 2.1 11/24/2018 0914   LDLCALC 75 11/24/2018 0914    Physical Exam:    VS:  BP 124/62   Pulse 81   Ht 6' (1.829 m)   Wt 205 lb 12.8 oz (93.4 kg)   SpO2 94%    BMI 27.91 kg/m     Wt Readings from Last 3 Encounters:  05/11/20 205 lb 12.8 oz (93.4 kg)  03/09/19 204 lb 12.8 oz (92.9 kg)  11/24/18 202 lb 3.2 oz (91.7 kg)     GEN: Well nourished, well developed in no acute distress HEENT: Normal NECK: No JVD; No carotid bruits LYMPHATICS: No lymphadenopathy CARDIAC: S1S2 noted,RRR, no murmurs, rubs, gallops RESPIRATORY:  Clear to auscultation without rales, wheezing or rhonchi  ABDOMEN: Soft, non-tender, non-distended, +bowel sounds, no guarding. EXTREMITIES: No edema, No cyanosis, no clubbing MUSCULOSKELETAL:  No deformity  SKIN: Warm and dry NEUROLOGIC:  Alert and oriented x 3, non-focal PSYCHIATRIC:  Normal affect, good insight  ASSESSMENT:    1. Dyspnea on exertion   2. Dilated cardiomyopathy ([place])   3. Hypertensive heart disease with heart failure ([place])   4. LBBB (left bundle branch block)  5. Chronic systolic heart failure ([place])   6. Dyslipidemia    PLAN:     Thankfully clinical exam does not show any significant volume overload.  He has been tolerating his diuretics.  His shortness of breath is out of proportion.  In 2017 he did have some mild to moderate CAD with no intervention.  Medical therapy was recommended at that time.  Although his shortness of breath could be multifactorial given his heart failure with reduced ejection fraction, and likely need for cardiac resynchronization therapy.  I have discussed with the patient for right and left heart catheterization to assess for worsening CAD and the need for benefit of PCI.  The patient understands that risks include but are not limited to stroke (1 in 1000), death (1 in 17), kidney failure [usually temporary] (1 in 500), bleeding (1 in 200), allergic reaction [possibly serious] (1 in 200), and agrees to proceed.  He also needs to follow-up with EP, given his left bundle his depressed ejection fraction the patient will benefit from cardiac resynchronization therapy as well.     Will continue on aspirin and statin for now.  In terms of his depressed ejection fraction he is currently on Entresto 49-51, he has been intolerant to beta-blocker therefore working to keep him off of this.  His most recent echocardiogram was done in April 2021 showing EF of 20 to 25%.  He appears to be euvolemic - continue current diuretics dose.  The patient is in agreement with the above plan. The patient left the office in stable condition.  The patient will follow up in 2 weeks post heart catheterization.   Medication Adjustments/Labs and Tests Ordered: Current medicines are reviewed at length with the patient today.  Concerns regarding medicines are outlined above.  No orders of the defined types were placed in this encounter.  No orders of the defined types were placed in this encounter.   Patient Instructions  Medication Instructions:  Your physician recommends that you continue on your current medications as directed. Please refer to the Current Medication list given to you today.  *If you need a refill on your cardiac medications before your next appointment, please call your pharmacy*   Lab Work: Your physician recommends that you return for lab work: TODAY: BMET, Mag, CBC, BNP If you have labs (blood work) drawn today and your tests are completely normal, you will receive your results only by: Marland Kitchen MyChart Message (if you have MyChart) OR . A paper copy in the mail If you have any lab test that is abnormal or we need to change your treatment, we will call you to review the results.   Testing/Procedures:    [place] [id number] Dept: [phone] Loc: [place]  05/11/2020  You are scheduled for a Cardiac Catheterization on Monday, May 2 with Dr. [name].  1. Please arrive at the [facility] (Main Entrance A) at [facility]:  [address] [place], [place] [address] at 5:30 AM (This time is two hours before your procedure to ensure your preparation). Free valet parking service is available.   Special note: Every effort is made to have your procedure done on time. Please understand that emergencies sometimes delay scheduled procedures.  2. Diet: Do not eat solid foods after midnight.  The patient may have clear liquids until 5am upon the day of the procedure.  3. Labs: You  will need to have blood drawn on TODAY.  4. Medication instructions in preparation for your procedure:   Contrast Allergy: No  On the morning of your procedure, take your Aspirin and any morning medicines NOT listed above.  You may use sips of water.  5. Plan for one night stay--bring personal belongings. 6. Bring a current list of your medications and current insurance cards. 7. You MUST have a responsible person to drive you home. 8. Someone MUST be with you the first 24 hours after you arrive home or your discharge will be delayed. 9. Please wear clothes that are easy to get on and off and wear slip-on shoes.  Thank you for allowing [place] to care for you!   -- [facility]    Follow-Up: At [facility] you and your health needs are our priority.  As part of our continuing mission to provide you with exceptional heart care, we have created designated Provider Care Teams.  These Care Teams include your primary Cardiologist (physician) and Advanced Practice Providers (APPs -  Physician Assistants and Nurse Practitioners) who all work together to provide you with the care you need, when you need it.  We recommend signing up for the patient portal called "MyChart".  Sign up information is provided on this After Visit Summary.  MyChart is used to connect with patients for Virtual Visits (Telemedicine).  Patients are able to view lab/test results, encounter notes, upcoming appointments, etc.  Non-urgent messages  can be sent to your provider as well.   To learn more about what you can do with MyChart, go to [email or web address].    Your next appointment:   2 week(s) post Cath  The format for your next appointment:   In Person  Provider:   Berniece Salines, DO   Other Instructions      Adopting a Healthy Lifestyle.  Know what a healthy weight is for you (roughly BMI <25) and aim to maintain this   Aim for 7+ servings of fruits and vegetables daily   65-80+ fluid ounces of water or unsweet tea for healthy kidneys   Limit to max 1 drink of alcohol per day; avoid smoking/tobacco   Limit animal fats in diet for cholesterol and heart health - choose grass fed whenever available   Avoid highly processed foods, and foods high in saturated/trans fats   Aim for low stress - take time to unwind and care for your mental health   Aim for 150 min of moderate intensity exercise weekly for heart health, and weights twice weekly for bone health   Aim for 7-9 hours of sleep daily   When it comes to diets, agreement about the perfect plan isnt easy to find, even among the experts. Experts at the [employer] developed an idea known as the Healthy Eating Plate. Just imagine a plate divided into logical, healthy portions.   The emphasis is on diet quality:   Load up on vegetables and fruits - one-half of your plate: Aim for color and variety, and remember that potatoes dont count.   Go for whole grains - one-quarter of your plate: Whole wheat, barley, wheat berries, quinoa, oats, brown rice, and foods made with them. If you want pasta, go with whole wheat pasta.   Protein power - one-quarter of your plate: Fish, chicken, beans, and nuts are all healthy, versatile protein sources. Limit red meat.   The diet, however, does go beyond the plate, offering a  few other suggestions.   Use healthy plant oils, such as olive, canola, soy, corn, sunflower and peanut. Check the labels, and  avoid partially hydrogenated oil, which have unhealthy trans fats.   If youre thirsty, drink water. Coffee and tea are good in moderation, but skip sugary drinks and limit milk and dairy products to one or two daily servings.   The type of carbohydrate in the diet is more important than the amount. Some sources of carbohydrates, such as vegetables, fruits, whole grains, and beans-are healthier than others.   Finally, stay active  Signed, Berniece Salines, DO  05/11/2020 10:48 AM    [place]

## 2020-05-11 NOTE — Progress Notes (Addendum)
Cardiology Office Note:    Date:  05/11/2020   ID:  Glenn Sullivan, DOB 08/25/1940, MRN [id number]  PCP:  Amin, Saad, MD  Cardiologist:  Brian Munley, MD  Electrophysiologist:  None   Referring MD: Amin, Saad, MD   Home having significant worsening shortness of breath.  History of Present Illness:    Glenn Sullivan is a 80 y.o. male with a hx of coronary artery disease had a heart catheterization 2017 with mild to moderate diffuse CAD,heart failure with reduced ejection fraction EF 35%, hypertension, hyperlipidemia, severe cardiomyopathy, peripheral artery disease  The patient is followed by Dr. Munley and was last seen by him in February 2021 at that time he appeared to be compensated from a heart failure standpoint.  During that visit he did discuss possibility of CRT-D and continue his guideline medical therapy.  The patient requested to be seen today due to shortness of breath.  The patient is wife is here today they saw me that he is experiencing significant shortness of breath on exertion.  Minimal exertion like walking up walking few steps makes him feel short of breath that he has to stop.  Even with his ADLs he is getting short of breath.   Past Medical History:  Diagnosis Date  . AAA (abdominal aortic aneurysm) (HCC)   . AAA (abdominal aortic aneurysm) without rupture (HCC) 12/24/2013  . Barrett's esophagus   . Cardiomyopathy (HCC) 03/07/2018   EF 35% in 2017  . Chronic systolic heart failure (HCC) 04/25/2018  . Coronary artery disease 03/07/2018   Left heart cath 2017:Conclusions Peripheral Procedure Description Patent stent graft in abdominal aorta Patent Renal artery stents Diagnostic Procedure Summary Diffuse Mild to Moderate calcific coronary artery disease. IFR in the RCA was 1.0 after hyperemic response with adenosine. Moderate global LV systolic dysfunction. LV ejection fraction is 35% Diagnostic Procedure Recommendations Medical the  . Diarrhea 04/28/2018  . Dyslipidemia  10/28/2015  . H/O aortic aneurysm repair 10/28/2015  . Hypertensive heart disease with heart failure (HCC) 03/07/2018  . LBBB (left bundle branch block) 04/25/2018   QRSD 180 msec  . Myocardial infarction (HCC)     Past Surgical History:  Procedure Laterality Date  . ABDOMINAL AORTIC ANEURYSM REPAIR    . EVAR    . RENAL ARTERY STENT      Current Medications: Current Meds  Medication Sig  . aspirin EC 81 MG tablet Take 81 mg by mouth daily.  . atorvastatin (LIPITOR) 40 MG tablet Take 1 tablet (40 mg total) by mouth daily.  . fluticasone (FLONASE) 50 MCG/ACT nasal spray USE ONE SPRAY IN EACH NOSTRIL ONCE A DAY FOR 90 DAYS  . gabapentin (NEURONTIN) 300 MG capsule Take 300 mg by mouth at bedtime.  . omeprazole (PRILOSEC) 20 MG capsule Take 20 mg by mouth daily.  . sacubitril-valsartan (ENTRESTO) 49-51 MG Take 1 tablet by mouth 2 (two) times daily.  . torsemide (DEMADEX) 20 MG tablet TAKE 1 TABLET BY MOUTH DAILY, EXCEPT 2 TABLETS ON MONDAY, WEDNESDAY, AND FRIDAY     Allergies:   Tetanus toxoids, Lisinopril, and Tetanus toxoid   Social History   Socioeconomic History  . Marital status: Married    Spouse name: Not on file  . Number of children: Not on file  . Years of education: Not on file  . Highest education level: Not on file  Occupational History  . Not on file  Tobacco Use  . Smoking status: Former Smoker    Types: Cigarettes      Quit date: 01/16/2007    Years since quitting: 13.3  . Smokeless tobacco: Never Used  Vaping Use  . Vaping Use: Never used  Substance and Sexual Activity  . Alcohol use: Yes    Comment: occ  . Drug use: No  . Sexual activity: Not on file  Other Topics Concern  . Not on file  Social History Narrative  . Not on file   Social Determinants of Health   Financial Resource Strain: Not on file  Food Insecurity: Not on file  Transportation Needs: Not on file  Physical Activity: Not on file  Stress: Not on file  Social Connections: Not on file      Family History: The patient's family history includes Diabetes in his mother; Heart disease in his mother; Hypertension in his mother; Varicose Veins in his mother.  ROS:   Review of Systems  Constitution: Negative for decreased appetite, fever and weight gain.  HENT: Negative for congestion, ear discharge, hoarse voice and sore throat.   Eyes: Negative for discharge, redness, vision loss in right eye and visual halos.  Cardiovascular: Negative for chest pain, dyspnea on exertion, leg swelling, orthopnea and palpitations.  Respiratory: Negative for cough, hemoptysis, shortness of breath and snoring.   Endocrine: Negative for heat intolerance and polyphagia.  Hematologic/Lymphatic: Negative for bleeding problem. Does not bruise/bleed easily.  Skin: Negative for flushing, nail changes, rash and suspicious lesions.  Musculoskeletal: Negative for arthritis, joint pain, muscle cramps, myalgias, neck pain and stiffness.  Gastrointestinal: Negative for abdominal pain, bowel incontinence, diarrhea and excessive appetite.  Genitourinary: Negative for decreased libido, genital sores and incomplete emptying.  Neurological: Negative for brief paralysis, focal weakness, headaches and loss of balance.  Psychiatric/Behavioral: Negative for altered mental status, depression and suicidal ideas.  Allergic/Immunologic: Negative for HIV exposure and persistent infections.    EKGs/Labs/Other Studies Reviewed:    The following studies were reviewed today:   EKG:  The ekg ordered today demonstrates sinus rhythm, heart rate 81 bpm with first-degree AV block and underlying left bundle branch block.  Echocardiogram April 23, 2019 shows IMPRESSIONS  1. Left ventricular ejection fraction, by estimation, is 20 to 25%. The  left ventricle has severely decreased function. The left ventricle  demonstrates regional wall motion abnormalities (see scoring  diagram/findings for description). The left   ventricular internal cavity size was severely dilated. Left ventricular  diastolic parameters are consistent with Grade I diastolic dysfunction  (impaired relaxation).  2. Right ventricular systolic function is normal. The right ventricular  size is normal. There is normal pulmonary artery systolic pressure.  3. Left atrial size was severely dilated.  4. The mitral valve is normal in structure. Mild mitral valve  regurgitation. No evidence of mitral stenosis.  5. The aortic valve is normal in structure. Aortic valve regurgitation is  trivial. No aortic stenosis is present.  6. There is mild to moderate dilatation of the ascending aorta measuring  41 mm.  7. The inferior vena cava is normal in size with greater than 50%  respiratory variability, suggesting right atrial pressure of 3 mmHg.    Left heart catheterization in 2017 Procedure Type  Diagnostic procedure: Left Heart Cath , Selective Coronaries, Left  Ventricular Injection  Peripheral Cath Diagnostic Procedure:  FFR:  Complications: No Complications.  Conclusions  Peripheral Procedure Description  Patent stent graft in abdominal aorta  Patent Renal artery stents  Diagnostic Procedure Summary  Diffuse Mild to Moderate calcific coronary artery disease.  IFR in the RCA was  1.0 after hyperemic response with adenosine.  Moderate global LV systolic dysfunction. LV ejection fraction is 35%  Diagnostic Procedure Recommendations  Medical therapy for CAD, LV dysfunction  Medical Therapy because no target lesion for intervention is seen.  Will DC amlodipine, start Coreg for CAD & LV dysfunction  I have reviewed the recent history and physical documentation. I personally  spent 27 minutes continuously monitoring the patient during theadministration  of moderate sedation. Pre and post activities have been reviewed. I was  present for the entire procedure.  Signatures  Electronically signed by Clarene Critchley, MD,  FACC(Diagnostic  Physician) on 11/04/2015 17:03  Angiographic findings  Cardiac Arteries and Lesion Findings  LMCA:  Lesion on LMCA: Distal subsection.40% stenosis 13 mm length . The lesion  was eccentric and heavily calcified.  LAD:  Lesion on Prox LAD: 35% stenosis 11 mm length . The lesion was heavily  calcified.  Lesion on 1st Diag: 15% stenosis 6 mm length .  LCx:  Lesion on 1st Ob Marg: Ostial.20% stenosis 8 mm length .  RCA:  Lesion on Prox RCA: Proximal subsection.50% stenosis 5 mm length . The  lesion was eccentric and heavily calcified.  FFR  +----+-----------------+------------+  !FFR !Stage/Medication !Dosage(mcg) !  +----+-----------------+------------+  Lesion on Dist RCA: 30% stenosis 9 mm length . The lesion was lightly  calcified.  Ramus:  Lesion on Ramus: 10% stenosis 63mm length . Poor run off was present.  Peripheral Arteries and Lesion Findings  Renal, Left: < 50% ostial narrowing  Renal, Right: 0%.  Aorta: Patent Stent graft, no stenosis or leak evident  Procedure Data  Procedure Date  Date: 11/04/2015 Start: 09:10  Entry Locations  - Retrograde Percutaneous access was performed through the Right Radial   artery. A 5 Fr sheath was inserted.  Contrast Material  - Omnipaque150 ml  Fluoroscopy Time: Diagnostic: 8:00 minutes. PCI: 0:00 minutes. Total: 8:00  minutes.  Admission Data  Admission Date: 11/04/2015  Coronary Tree  Dominance: Right  VA  LV function assessed [id number].  Ejection Fraction  - Method: Estimated. EF%: 35.    Recent Labs: No results found for requested labs within last 8760 hours.  Recent Lipid Panel    Component Value Date/Time   CHOL 165 11/24/2018 0914   TRIG 67 11/24/2018 0914   HDL 77 11/24/2018 0914   CHOLHDL 2.1 11/24/2018 0914   LDLCALC 75 11/24/2018 0914    Physical Exam:    VS:  BP 124/62   Pulse 81   Ht 6' (1.829 m)   Wt 205 lb 12.8 oz (93.4 kg)   SpO2 94%    BMI 27.91 kg/m     Wt Readings from Last 3 Encounters:  05/11/20 205 lb 12.8 oz (93.4 kg)  03/09/19 204 lb 12.8 oz (92.9 kg)  11/24/18 202 lb 3.2 oz (91.7 kg)     GEN: Well nourished, well developed in no acute distress HEENT: Normal NECK: No JVD; No carotid bruits LYMPHATICS: No lymphadenopathy CARDIAC: S1S2 noted,RRR, no murmurs, rubs, gallops RESPIRATORY:  Clear to auscultation without rales, wheezing or rhonchi  ABDOMEN: Soft, non-tender, non-distended, +bowel sounds, no guarding. EXTREMITIES: No edema, No cyanosis, no clubbing MUSCULOSKELETAL:  No deformity  SKIN: Warm and dry NEUROLOGIC:  Alert and oriented x 3, non-focal PSYCHIATRIC:  Normal affect, good insight  ASSESSMENT:    1. Dyspnea on exertion   2. Dilated cardiomyopathy ([place])   3. Hypertensive heart disease with heart failure ([place])   4. LBBB (left bundle branch block)  5. Chronic systolic heart failure ([place])   6. Dyslipidemia    PLAN:     Thankfully clinical exam does not show any significant volume overload.  He has been tolerating his diuretics.  His shortness of breath is out of proportion.  In 2017 he did have some mild to moderate CAD with no intervention.  Medical therapy was recommended at that time.  Although his shortness of breath could be multifactorial given his heart failure with reduced ejection fraction, and likely need for cardiac resynchronization therapy.  I have discussed with the patient for right and left heart catheterization to assess for worsening CAD and the need for benefit of PCI.  The patient understands that risks include but are not limited to stroke (1 in 1000), death (1 in 17), kidney failure [usually temporary] (1 in 500), bleeding (1 in 200), allergic reaction [possibly serious] (1 in 200), and agrees to proceed.  He also needs to follow-up with EP, given his left bundle his depressed ejection fraction the patient will benefit from cardiac resynchronization therapy as well.     Will continue on aspirin and statin for now.  In terms of his depressed ejection fraction he is currently on Entresto 49-51, he has been intolerant to beta-blocker therefore working to keep him off of this.  His most recent echocardiogram was done in April 2021 showing EF of 20 to 25%.  He appears to be euvolemic - continue current diuretics dose.  The patient is in agreement with the above plan. The patient left the office in stable condition.  The patient will follow up in 2 weeks post heart catheterization.   Medication Adjustments/Labs and Tests Ordered: Current medicines are reviewed at length with the patient today.  Concerns regarding medicines are outlined above.  No orders of the defined types were placed in this encounter.  No orders of the defined types were placed in this encounter.   Patient Instructions  Medication Instructions:  Your physician recommends that you continue on your current medications as directed. Please refer to the Current Medication list given to you today.  *If you need a refill on your cardiac medications before your next appointment, please call your pharmacy*   Lab Work: Your physician recommends that you return for lab work: TODAY: BMET, Mag, CBC, BNP If you have labs (blood work) drawn today and your tests are completely normal, you will receive your results only by: Marland Kitchen MyChart Message (if you have MyChart) OR . A paper copy in the mail If you have any lab test that is abnormal or we need to change your treatment, we will call you to review the results.   Testing/Procedures:    [place] [address] Dept: [phone] Loc: [place]  05/11/2020  You are scheduled for a Cardiac Catheterization on Monday, May 2 with Dr. [name].  1. Please arrive at the [facility] (Main Entrance A) at [facility]:  [address] [place], [place] [address] at 5:30 AM (This time is two hours before your procedure to ensure your preparation). Free valet parking service is available.   Special note: Every effort is made to have your procedure done on time. Please understand that emergencies sometimes delay scheduled procedures.  2. Diet: Do not eat solid foods after midnight.  The patient may have clear liquids until 5am upon the day of the procedure.  3. Labs: You  will need to have blood drawn on TODAY.  4. Medication instructions in preparation for your procedure:   Contrast Allergy: No  On the morning of your procedure, take your Aspirin and any morning medicines NOT listed above.  You may use sips of water.  5. Plan for one night stay--bring personal belongings. 6. Bring a current list of your medications and current insurance cards. 7. You MUST have a responsible person to drive you home. 8. Someone MUST be with you the first 24 hours after you arrive home or your discharge will be delayed. 9. Please wear clothes that are easy to get on and off and wear slip-on shoes.  Thank you for allowing [place] to care for you!   -- [place] [facility]    Follow-Up: At [facility] you and your health needs are our priority.  As part of our continuing mission to provide you with exceptional heart care, we have created designated Provider Care Teams.  These Care Teams include your primary Cardiologist (physician) and Advanced Practice Providers (APPs -  Physician Assistants and Nurse Practitioners) who all work together to provide you with the care you need, when you need it.  We recommend signing up for the patient portal called "MyChart".  Sign up information is provided on this After Visit Summary.  MyChart is used to connect with patients for Virtual Visits (Telemedicine).  Patients are able to view lab/test results, encounter notes, upcoming appointments, etc.  Non-urgent messages  can be sent to your provider as well.   To learn more about what you can do with MyChart, go to [email or web address].    Your next appointment:   2 week(s) post Cath  The format for your next appointment:   In Person  Provider:   Berniece Salines, DO   Other Instructions      Adopting a Healthy Lifestyle.  Know what a healthy weight is for you (roughly BMI <25) and aim to maintain this   Aim for 7+ servings of fruits and vegetables daily   65-80+ fluid ounces of water or unsweet tea for healthy kidneys   Limit to max 1 drink of alcohol per day; avoid smoking/tobacco   Limit animal fats in diet for cholesterol and heart health - choose grass fed whenever available   Avoid highly processed foods, and foods high in saturated/trans fats   Aim for low stress - take time to unwind and care for your mental health   Aim for 150 min of moderate intensity exercise weekly for heart health, and weights twice weekly for bone health   Aim for 7-9 hours of sleep daily   When it comes to diets, agreement about the perfect plan isnt easy to find, even among the experts. Experts at the [employer] developed an idea known as the Healthy Eating Plate. Just imagine a plate divided into logical, healthy portions.   The emphasis is on diet quality:   Load up on vegetables and fruits - one-half of your plate: Aim for color and variety, and remember that potatoes dont count.   Go for whole grains - one-quarter of your plate: Whole wheat, barley, wheat berries, quinoa, oats, brown rice, and foods made with them. If you want pasta, go with whole wheat pasta.   Protein power - one-quarter of your plate: Fish, chicken, beans, and nuts are all healthy, versatile protein sources. Limit red meat.   The diet, however, does go beyond the plate, offering a  few other suggestions.   Use healthy plant oils, such as olive, canola, soy, corn, sunflower and peanut. Check the labels, and  avoid partially hydrogenated oil, which have unhealthy trans fats.   If youre thirsty, drink water. Coffee and tea are good in moderation, but skip sugary drinks and limit milk and dairy products to one or two daily servings.   The type of carbohydrate in the diet is more important than the amount. Some sources of carbohydrates, such as vegetables, fruits, whole grains, and beans-are healthier than others.   Finally, stay active  Signed, Berniece Salines, DO  05/11/2020 10:48 AM    [place]

## 2020-05-11 NOTE — Patient Instructions (Signed)
Medication Instructions:  Your physician recommends that you continue on your current medications as directed. Please refer to the Current Medication list given to you today.  *If you need a refill on your cardiac medications before your next appointment, please call your pharmacy*   Lab Work: Your physician recommends that you return for lab work: TODAY: BMET, Mag, CBC, BNP If you have labs (blood work) drawn today and your tests are completely normal, you will receive your results only by: Marland Kitchen MyChart Message (if you have MyChart) OR . A paper copy in the mail If you have any lab test that is abnormal or we need to change your treatment, we will call you to review the results.   Testing/Procedures:    [place] [address] Dept: [phone] Loc: [place]  05/11/2020  You are scheduled for a Cardiac Catheterization on Monday, May 2 with Dr. [name].  1. Please arrive at the [facility] (Main Entrance A) at [facility]: [address] [place], [place] [address] at 5:30 AM (This time is two hours before your procedure to ensure your preparation). Free valet parking service is available.   Special note: Every effort is made to have your procedure done on time. Please understand that emergencies sometimes delay scheduled procedures.  2. Diet: Do not eat solid foods after midnight.  The patient may have clear liquids until 5am upon the day of the procedure.  3. Labs: You will need to have blood drawn on TODAY.  4. Medication instructions in preparation for your procedure:   Contrast Allergy: No  On the morning of your procedure, take your Aspirin and any morning medicines NOT listed above.  You may use sips of water.  5. Plan for one night stay--bring personal belongings. 6. Bring a current list of your medications and current insurance  cards. 7. You MUST have a responsible person to drive you home. 8. Someone MUST be with you the first 24 hours after you arrive home or your discharge will be delayed. 9. Please wear clothes that are easy to get on and off and wear slip-on shoes.  Thank you for allowing [place] to care for you!   -- [place] [facility]    Follow-Up: At [facility], you and your health needs are our priority.  As part of our continuing mission to provide you with exceptional heart care, we have created designated Provider Care Teams.  These Care Teams include your primary Cardiologist (physician) and Advanced Practice Providers (APPs -  Physician Assistants and Nurse Practitioners) who all work together to provide you with the care you need, when you need it.  We recommend signing up for the patient portal called "MyChart".  Sign up information is provided on this After Visit Summary.  MyChart is used to connect with patients for Virtual Visits (Telemedicine).  Patients are able to view lab/test results, encounter notes, upcoming appointments, etc.  Non-urgent messages can be sent to your provider as well.   To learn more about what you can do with MyChart, go to [email or web address].    Your next appointment:   2 week(s) post Cath  The format for your next appointment:   In Person  Provider:   Berniece Salines, DO   Other Instructions

## 2020-05-12 ENCOUNTER — Telehealth: Payer: Self-pay | Admitting: *Deleted

## 2020-05-12 LAB — BASIC METABOLIC PANEL
BUN/Creatinine Ratio: 17 (ref 10–24)
BUN: 24 mg/dL (ref 8–27)
CO2: 25 mmol/L (ref 20–29)
Calcium: 9.3 mg/dL (ref 8.6–10.2)
Chloride: 100 mmol/L (ref 96–106)
Creatinine, Ser: 1.38 mg/dL — ABNORMAL HIGH (ref 0.76–1.27)
Glucose: 104 mg/dL — ABNORMAL HIGH (ref 65–99)
Potassium: 4.8 mmol/L (ref 3.5–5.2)
Sodium: 139 mmol/L (ref 134–144)
eGFR: 52 mL/min/{1.73_m2} — ABNORMAL LOW (ref >59)

## 2020-05-12 LAB — CBC WITH DIFFERENTIAL/PLATELET
Basophils Absolute: 0.1 10*3/uL (ref 0.0–0.2)
Basos: 1 %
EOS (ABSOLUTE): 0.3 10*3/uL (ref 0.0–0.4)
Eos: 4 %
Hematocrit: 41.2 % (ref 37.5–51.0)
Hemoglobin: 14.2 g/dL (ref 13.0–17.7)
Immature Grans (Abs): 0 10*3/uL (ref 0.0–0.1)
Immature Granulocytes: 0 %
Lymphocytes Absolute: 1.2 10*3/uL (ref 0.7–3.1)
Lymphs: 16 %
MCH: 31.6 pg (ref 26.6–33.0)
MCHC: 34.5 g/dL (ref 31.5–35.7)
MCV: 92 fL (ref 79–97)
Monocytes Absolute: 0.9 10*3/uL (ref 0.1–0.9)
Monocytes: 11 %
Neutrophils Absolute: 5.5 10*3/uL (ref 1.4–7.0)
Neutrophils: 68 %
Platelets: 227 10*3/uL (ref 150–450)
RBC: 4.5 x10E6/uL (ref 4.14–5.80)
RDW: 11.7 % (ref 11.6–15.4)
WBC: 8 10*3/uL (ref 3.4–10.8)

## 2020-05-12 LAB — PRO B NATRIURETIC PEPTIDE: NT-Pro BNP: 2980 pg/mL — ABNORMAL HIGH (ref 0–486)

## 2020-05-12 LAB — MAGNESIUM: Magnesium: 2.1 mg/dL (ref 1.6–2.3)

## 2020-05-12 NOTE — Telephone Encounter (Signed)
Pt contacted pre-catheterization scheduled at [facility] for: Monday May 16, 2020 7:30 AM Verified arrival time and place: [place] [name]) at: 5:30 AM   No solid food after midnight prior to cath, clear liquids until 5 AM day of procedure.  Hold: Torsemide-AM of procedure Entresto-PM prior/AM procedure-GFR 52  Except hold medications AM meds can be  taken pre-cath with sips of water including: ASA 81 mg   Confirmed patient has responsible adult to drive home post procedure and be with patient first 24 hours after arriving home: yes  You are allowed ONE visitor in the waiting room during the time you are at the hospital for your procedure. Both you and your visitor must wear a mask once you enter the hospital.   Reviewed procedure/mask/visitor instructions with patient.

## 2020-05-13 ENCOUNTER — Other Ambulatory Visit (HOSPITAL_COMMUNITY)
Admission: RE | Admit: 2020-05-13 | Discharge: 2020-05-13 | Disposition: A | Discharge status: Home / Self Care | Payer: Medicare Other | Source: Ambulatory Visit | Attending: Cardiology | Admitting: Cardiology

## 2020-05-13 DIAGNOSIS — Z20822 Contact with and (suspected) exposure to covid-19: Secondary | ICD-10-CM | POA: Insufficient documentation

## 2020-05-13 DIAGNOSIS — Z01812 Encounter for preprocedural laboratory examination: Secondary | ICD-10-CM | POA: Insufficient documentation

## 2020-05-13 LAB — SARS CORONAVIRUS 2 (TAT 6-24 HRS): SARS Coronavirus 2: NEGATIVE

## 2020-05-14 ENCOUNTER — Encounter (HOSPITAL_COMMUNITY): Payer: Self-pay | Admitting: Cardiology

## 2020-05-16 ENCOUNTER — Observation Stay (HOSPITAL_COMMUNITY)
Admission: RE | Admit: 2020-05-16 | Discharge: 2020-05-17 | Disposition: A | Discharge status: Home / Self Care | Payer: Medicare Other | Source: Ambulatory Visit | Attending: Cardiology | Admitting: Cardiology

## 2020-05-16 ENCOUNTER — Encounter (HOSPITAL_COMMUNITY): Payer: Self-pay | Admitting: Cardiology

## 2020-05-16 ENCOUNTER — Telehealth: Payer: Self-pay | Admitting: Cardiology

## 2020-05-16 ENCOUNTER — Encounter (HOSPITAL_COMMUNITY)
Admission: RE | Disposition: A | Discharge status: Home / Self Care | Payer: Self-pay | Source: Ambulatory Visit | Attending: Cardiology

## 2020-05-16 ENCOUNTER — Other Ambulatory Visit: Payer: Self-pay

## 2020-05-16 ENCOUNTER — Observation Stay (HOSPITAL_COMMUNITY): Payer: Medicare Other

## 2020-05-16 DIAGNOSIS — I25118 Atherosclerotic heart disease of native coronary artery with other forms of angina pectoris: Secondary | ICD-10-CM

## 2020-05-16 DIAGNOSIS — I447 Left bundle-branch block, unspecified: Secondary | ICD-10-CM | POA: Diagnosis present

## 2020-05-16 DIAGNOSIS — I5043 Acute on chronic combined systolic (congestive) and diastolic (congestive) heart failure: Secondary | ICD-10-CM | POA: Diagnosis not present

## 2020-05-16 DIAGNOSIS — I429 Cardiomyopathy, unspecified: Secondary | ICD-10-CM

## 2020-05-16 DIAGNOSIS — Z7982 Long term (current) use of aspirin: Secondary | ICD-10-CM | POA: Insufficient documentation

## 2020-05-16 DIAGNOSIS — I110 Hypertensive heart disease with heart failure: Secondary | ICD-10-CM | POA: Diagnosis not present

## 2020-05-16 DIAGNOSIS — Z87891 Personal history of nicotine dependence: Secondary | ICD-10-CM | POA: Insufficient documentation

## 2020-05-16 DIAGNOSIS — Z79899 Other long term (current) drug therapy: Secondary | ICD-10-CM | POA: Diagnosis not present

## 2020-05-16 DIAGNOSIS — I509 Heart failure, unspecified: Secondary | ICD-10-CM | POA: Diagnosis not present

## 2020-05-16 DIAGNOSIS — R0602 Shortness of breath: Secondary | ICD-10-CM | POA: Diagnosis present

## 2020-05-16 DIAGNOSIS — I25119 Atherosclerotic heart disease of native coronary artery with unspecified angina pectoris: Secondary | ICD-10-CM | POA: Diagnosis not present

## 2020-05-16 DIAGNOSIS — I4892 Unspecified atrial flutter: Secondary | ICD-10-CM | POA: Diagnosis present

## 2020-05-16 DIAGNOSIS — I25110 Atherosclerotic heart disease of native coronary artery with unstable angina pectoris: Secondary | ICD-10-CM | POA: Diagnosis not present

## 2020-05-16 DIAGNOSIS — R0600 Dyspnea, unspecified: Secondary | ICD-10-CM

## 2020-05-16 DIAGNOSIS — I2510 Atherosclerotic heart disease of native coronary artery without angina pectoris: Secondary | ICD-10-CM | POA: Diagnosis present

## 2020-05-16 DIAGNOSIS — R0609 Other forms of dyspnea: Secondary | ICD-10-CM

## 2020-05-16 HISTORY — PX: RIGHT/LEFT HEART CATH AND CORONARY ANGIOGRAPHY: CATH118266

## 2020-05-16 LAB — POCT I-STAT 7, (LYTES, BLD GAS, ICA,H+H)
Acid-Base Excess: 0 mmol/L (ref 0.0–2.0)
Bicarbonate: 24.1 mmol/L (ref 20.0–28.0)
Calcium, Ion: 1.15 mmol/L (ref 1.15–1.40)
HCT: 38 % — ABNORMAL LOW (ref 39.0–52.0)
Hemoglobin: 12.9 g/dL — ABNORMAL LOW (ref 13.0–17.0)
O2 Saturation: 97 %
Potassium: 3.9 mmol/L (ref 3.5–5.1)
Sodium: 139 mmol/L (ref 135–145)
TCO2: 25 mmol/L (ref 22–32)
pCO2 arterial: 37.8 mmHg (ref 32.0–48.0)
pH, Arterial: 7.413 (ref 7.350–7.450)
pO2, Arterial: 87 mmHg (ref 83.0–108.0)

## 2020-05-16 LAB — POCT I-STAT EG7
Acid-Base Excess: 1 mmol/L (ref 0.0–2.0)
Acid-Base Excess: 2 mmol/L (ref 0.0–2.0)
Bicarbonate: 26.5 mmol/L (ref 20.0–28.0)
Bicarbonate: 27.5 mmol/L (ref 20.0–28.0)
Calcium, Ion: 1.18 mmol/L (ref 1.15–1.40)
Calcium, Ion: 1.2 mmol/L (ref 1.15–1.40)
HCT: 38 % — ABNORMAL LOW (ref 39.0–52.0)
HCT: 38 % — ABNORMAL LOW (ref 39.0–52.0)
Hemoglobin: 12.9 g/dL — ABNORMAL LOW (ref 13.0–17.0)
Hemoglobin: 12.9 g/dL — ABNORMAL LOW (ref 13.0–17.0)
O2 Saturation: 66 %
O2 Saturation: 67 %
Potassium: 3.9 mmol/L (ref 3.5–5.1)
Potassium: 3.9 mmol/L (ref 3.5–5.1)
Sodium: 140 mmol/L (ref 135–145)
Sodium: 140 mmol/L (ref 135–145)
TCO2: 28 mmol/L (ref 22–32)
TCO2: 29 mmol/L (ref 22–32)
pCO2, Ven: 44.4 mmHg (ref 44.0–60.0)
pCO2, Ven: 45.5 mmHg (ref 44.0–60.0)
pH, Ven: 7.385 (ref 7.250–7.430)
pH, Ven: 7.389 (ref 7.250–7.430)
pO2, Ven: 35 mmHg (ref 32.0–45.0)
pO2, Ven: 36 mmHg (ref 32.0–45.0)

## 2020-05-16 LAB — PULMONARY FUNCTION TEST
FEF 25-75 Pre: 2.06 L/sec
FEF2575-%Pred-Pre: 95 %
FEV1-%Pred-Pre: 66 %
FEV1-Pre: 2.07 L
FEV1FVC-%Pred-Pre: 115 %
FEV6-%Pred-Pre: 61 %
FEV6-Pre: 2.5 L
FEV6FVC-%Pred-Pre: 106 %
FVC-%Pred-Pre: 57 %
FVC-Pre: 2.5 L
Pre FEV1/FVC ratio: 83 %
Pre FEV6/FVC Ratio: 100 %

## 2020-05-16 SURGERY — RIGHT/LEFT HEART CATH AND CORONARY ANGIOGRAPHY
Anesthesia: LOCAL

## 2020-05-16 MED ORDER — SODIUM CHLORIDE 0.9 % IV SOLN: 250.0000 mL | INTRAVENOUS | Status: DC | PRN

## 2020-05-16 MED ORDER — FENTANYL CITRATE (PF) 100 MCG/2ML IJ SOLN
INTRAMUSCULAR | Status: AC
  Filled 2020-05-16: qty 2

## 2020-05-16 MED ORDER — FENTANYL CITRATE (PF) 100 MCG/2ML IJ SOLN
INTRAMUSCULAR | Status: DC | PRN
  Administered 2020-05-16: 25 ug via INTRAVENOUS

## 2020-05-16 MED ORDER — SACUBITRIL-VALSARTAN 49-51 MG PO TABS
1.0000 | ORAL_TABLET | Freq: Two times a day (BID) | ORAL | Status: DC
  Administered 2020-05-16 – 2020-05-17 (×2): 1 via ORAL
  Filled 2020-05-16 (×3): qty 1

## 2020-05-16 MED ORDER — ASPIRIN 81 MG PO CHEW
CHEWABLE_TABLET | ORAL | Status: AC
  Administered 2020-05-16: 81 mg via ORAL
  Filled 2020-05-16: qty 1

## 2020-05-16 MED ORDER — FLUTICASONE PROPIONATE 50 MCG/ACT NA SUSP
1.0000 | Freq: Every day | NASAL | Status: DC | PRN
  Filled 2020-05-16: qty 16

## 2020-05-16 MED ORDER — HEPARIN (PORCINE) IN NACL 1000-0.9 UT/500ML-% IV SOLN
INTRAVENOUS | Status: DC | PRN
  Administered 2020-05-16 (×2): 500 mL

## 2020-05-16 MED ORDER — VERAPAMIL HCL 2.5 MG/ML IV SOLN
INTRAVENOUS | Status: AC
  Filled 2020-05-16: qty 2

## 2020-05-16 MED ORDER — HYDRALAZINE HCL 20 MG/ML IJ SOLN: 10.0000 mg | INTRAMUSCULAR | Status: AC | PRN

## 2020-05-16 MED ORDER — ATORVASTATIN CALCIUM 40 MG PO TABS
40.0000 mg | ORAL_TABLET | Freq: Every day | ORAL | Status: DC
  Administered 2020-05-16 – 2020-05-17 (×2): 40 mg via ORAL
  Filled 2020-05-16 (×2): qty 1

## 2020-05-16 MED ORDER — TORSEMIDE 20 MG PO TABS
40.0000 mg | ORAL_TABLET | Freq: Every day | ORAL | Status: DC
  Administered 2020-05-17: 40 mg via ORAL
  Filled 2020-05-16: qty 2

## 2020-05-16 MED ORDER — ASPIRIN 81 MG PO CHEW
81.0000 mg | CHEWABLE_TABLET | ORAL | Status: AC
Start: 2020-05-17 — End: 2020-05-16

## 2020-05-16 MED ORDER — SODIUM CHLORIDE 0.9% FLUSH: 3.0000 mL | INTRAVENOUS | Status: DC | PRN

## 2020-05-16 MED ORDER — FUROSEMIDE 10 MG/ML IJ SOLN
40.0000 mg | Freq: Once | INTRAMUSCULAR | Status: AC
  Administered 2020-05-16: 40 mg via INTRAVENOUS
  Filled 2020-05-16: qty 4

## 2020-05-16 MED ORDER — HEPARIN (PORCINE) 25000 UT/250ML-% IV SOLN
1400.0000 [IU]/h | INTRAVENOUS | Status: DC
  Administered 2020-05-16: 1200 [IU]/h via INTRAVENOUS
  Filled 2020-05-16: qty 250

## 2020-05-16 MED ORDER — ASPIRIN EC 81 MG PO TBEC
81.0000 mg | DELAYED_RELEASE_TABLET | Freq: Every day | ORAL | Status: DC
  Administered 2020-05-17: 81 mg via ORAL
  Filled 2020-05-16 (×2): qty 1

## 2020-05-16 MED ORDER — HEPARIN SODIUM (PORCINE) 1000 UNIT/ML IJ SOLN
INTRAMUSCULAR | Status: AC
  Filled 2020-05-16: qty 1

## 2020-05-16 MED ORDER — LIDOCAINE HCL (PF) 1 % IJ SOLN
INTRAMUSCULAR | Status: DC | PRN
  Administered 2020-05-16 (×2): 2 mL

## 2020-05-16 MED ORDER — LABETALOL HCL 5 MG/ML IV SOLN: 10.0000 mg | INTRAVENOUS | Status: AC | PRN

## 2020-05-16 MED ORDER — LIDOCAINE HCL (PF) 1 % IJ SOLN
INTRAMUSCULAR | Status: AC
  Filled 2020-05-16: qty 30

## 2020-05-16 MED ORDER — HEPARIN SODIUM (PORCINE) 1000 UNIT/ML IJ SOLN
INTRAMUSCULAR | Status: DC | PRN
  Administered 2020-05-16: 4500 [IU] via INTRAVENOUS

## 2020-05-16 MED ORDER — SODIUM CHLORIDE 0.9 % WEIGHT BASED INFUSION
3.0000 mL/kg/h | INTRAVENOUS | Status: DC
Start: 2020-05-16 — End: 2020-05-16
  Administered 2020-05-16: 3 mL/kg/h via INTRAVENOUS

## 2020-05-16 MED ORDER — ACETAMINOPHEN 325 MG PO TABS
650.0000 mg | ORAL_TABLET | ORAL | Status: DC | PRN
  Administered 2020-05-17: 650 mg via ORAL
  Filled 2020-05-16: qty 2

## 2020-05-16 MED ORDER — SODIUM CHLORIDE 0.9% FLUSH
3.0000 mL | Freq: Two times a day (BID) | INTRAVENOUS | Status: DC
  Administered 2020-05-16: 3 mL via INTRAVENOUS

## 2020-05-16 MED ORDER — MIDAZOLAM HCL 2 MG/2ML IJ SOLN
INTRAMUSCULAR | Status: DC | PRN
  Administered 2020-05-16 (×2): 1 mg via INTRAVENOUS

## 2020-05-16 MED ORDER — SODIUM CHLORIDE 0.9% FLUSH: 3.0000 mL | Freq: Two times a day (BID) | INTRAVENOUS | Status: DC

## 2020-05-16 MED ORDER — HEPARIN (PORCINE) IN NACL 1000-0.9 UT/500ML-% IV SOLN
INTRAVENOUS | Status: AC
  Filled 2020-05-16: qty 1000

## 2020-05-16 MED ORDER — IOHEXOL 350 MG/ML SOLN
INTRAVENOUS | Status: DC | PRN
  Administered 2020-05-16: 90 mL

## 2020-05-16 MED ORDER — SODIUM CHLORIDE 0.9 % WEIGHT BASED INFUSION: 1.0000 mL/kg/h | INTRAVENOUS | Status: DC

## 2020-05-16 MED ORDER — GABAPENTIN 300 MG PO CAPS: 300.0000 mg | ORAL_CAPSULE | Freq: Every evening | ORAL | Status: DC | PRN

## 2020-05-16 MED ORDER — PANTOPRAZOLE SODIUM 40 MG PO TBEC
40.0000 mg | DELAYED_RELEASE_TABLET | Freq: Every day | ORAL | Status: DC
  Administered 2020-05-16 – 2020-05-17 (×2): 40 mg via ORAL
  Filled 2020-05-16 (×2): qty 1

## 2020-05-16 MED ORDER — MIDAZOLAM HCL 2 MG/2ML IJ SOLN
INTRAMUSCULAR | Status: AC
  Filled 2020-05-16: qty 2

## 2020-05-16 MED ORDER — SODIUM CHLORIDE 0.9 % IV SOLN: INTRAVENOUS | Status: AC

## 2020-05-16 MED ORDER — ONDANSETRON HCL 4 MG/2ML IJ SOLN: 4.0000 mg | Freq: Four times a day (QID) | INTRAMUSCULAR | Status: DC | PRN

## 2020-05-16 MED ORDER — VERAPAMIL HCL 2.5 MG/ML IV SOLN
INTRAVENOUS | Status: DC | PRN
  Administered 2020-05-16: 10 mL via INTRA_ARTERIAL

## 2020-05-16 MED ORDER — CARVEDILOL 3.125 MG PO TABS
3.1250 mg | ORAL_TABLET | Freq: Two times a day (BID) | ORAL | Status: DC
  Administered 2020-05-16 – 2020-05-17 (×3): 3.125 mg via ORAL
  Filled 2020-05-16 (×3): qty 1

## 2020-05-16 SURGICAL SUPPLY — 17 items
CATH BALLN WEDGE 5F 110CM (CATHETERS) ×2 IMPLANT
CATH INFINITI 5 FR JL3.5 (CATHETERS) ×2 IMPLANT
CATH OPTITORQUE TIG 4.0 5F (CATHETERS) ×2 IMPLANT
DEVICE RAD COMP TR BAND LRG (VASCULAR PRODUCTS) ×2 IMPLANT
GLIDESHEATH SLEND SS 6F .021 (SHEATH) ×2 IMPLANT
GUIDEWIRE .025 260CM (WIRE) ×2 IMPLANT
GUIDEWIRE INQWIRE 1.5J.035X260 (WIRE) ×1 IMPLANT
INQWIRE 1.5J .035X260CM (WIRE) ×2
KIT HEART LEFT (KITS) ×2 IMPLANT
KIT MICROPUNCTURE NIT STIFF (SHEATH) ×2 IMPLANT
PACK CARDIAC CATHETERIZATION (CUSTOM PROCEDURE TRAY) ×2 IMPLANT
SHEATH GLIDE SLENDER 4/5FR (SHEATH) ×2 IMPLANT
SHEATH PROBE COVER 6X72 (BAG) ×2 IMPLANT
SYR MEDRAD MARK 7 150ML (SYRINGE) ×2 IMPLANT
TRANSDUCER W/STOPCOCK (MISCELLANEOUS) ×2 IMPLANT
TUBING CIL FLEX 10 FLL-RA (TUBING) ×2 IMPLANT
WIRE HI TORQ VERSACORE-J 145CM (WIRE) ×2 IMPLANT

## 2020-05-16 NOTE — Telephone Encounter (Signed)
PT is in the hospital.PT's wife is wanting a nurse give her a call.She states her husband is needing emergency surgery and she wants to know what the doctor recommends.Please advise

## 2020-05-16 NOTE — Telephone Encounter (Signed)
Spoke to the patients wife just now and let her know that Dr. [name] is out of the office this week but that Dr. [name] would recommend that she follow the advise and recommendations of Dr. Ellyn Hack. She verbalizes understanding and thanks me for the call back.

## 2020-05-16 NOTE — Plan of Care (Signed)
  Problem: Education: Goal: Knowledge of General Education information will improve Description: Including pain rating scale, medication(s)/side effects and non-pharmacologic comfort measures 05/16/2020 1958 by Robley Fries, RN Outcome: Progressing 05/16/2020 1957 by Robley Fries, RN Outcome: Progressing   Problem: Health Behavior/Discharge Planning: Goal: Ability to manage health-related needs will improve 05/16/2020 1958 by Robley Fries, RN Outcome: Progressing 05/16/2020 1957 by Robley Fries, RN Outcome: Progressing   Problem: Education: Goal: Understanding of CV disease, CV risk reduction, and recovery process will improve 05/16/2020 1958 by Robley Fries, RN Outcome: Progressing 05/16/2020 1957 by Robley Fries, RN Outcome: Progressing Goal: Individualized Educational Video(s) 05/16/2020 1958 by Robley Fries, RN Outcome: Progressing 05/16/2020 1957 by Robley Fries, RN Outcome: Progressing   Problem: Activity: Goal: Ability to return to baseline activity level will improve 05/16/2020 1958 by Robley Fries, RN Outcome: Progressing 05/16/2020 1957 by Robley Fries, RN Outcome: Progressing   Problem: Cardiovascular: Goal: Ability to achieve and maintain adequate cardiovascular perfusion will improve 05/16/2020 1958 by Robley Fries, RN Outcome: Progressing 05/16/2020 1957 by Robley Fries, RN Outcome: Progressing Goal: Vascular access site(s) Level 0-1 will be maintained 05/16/2020 1958 by Robley Fries, RN Outcome: Progressing 05/16/2020 1957 by Robley Fries, RN Outcome: Progressing   Problem: Health Behavior/Discharge Planning: Goal: Ability to safely manage health-related needs after discharge will improve 05/16/2020 1958 by Robley Fries, RN Outcome: Progressing 05/16/2020 1957 by Robley Fries, RN Outcome: Progressing   Problem: Education: Goal: Understanding of CV disease, CV risk reduction, and recovery process will  improve Outcome: Progressing Goal: Individualized Educational Video(s) Outcome: Progressing   Problem: Activity: Goal: Ability to return to baseline activity level will improve Outcome: Progressing   Problem: Cardiovascular: Goal: Ability to achieve and maintain adequate cardiovascular perfusion will improve Outcome: Progressing Goal: Vascular access site(s) Level 0-1 will be maintained Outcome: Progressing   Problem: Health Behavior/Discharge Planning: Goal: Ability to safely manage health-related needs after discharge will improve Outcome: Progressing

## 2020-05-16 NOTE — Progress Notes (Signed)
ANTICOAGULATION CONSULT NOTE - Initial Consult  Pharmacy Consult for heparin  Indication: chest pain/ACS and atrial fibrillation  Allergies  Allergen Reactions  . Tetanus Toxoids Swelling    Swelling at sight  . Lisinopril Other (See Comments)    Patient Measurements: Height: 6' (182.9 cm) Weight: 94.1 kg (207 lb 6.4 oz) IBW/kg (Calculated) : 77.6  Vital Signs: Temp: 97.6 F (36.4 C) (05/02 1219) Temp Source: Oral (05/02 1219) BP: 100/88 (05/02 1219) Pulse Rate: 91 (05/02 1219)  Labs: Recent Labs    05/16/20 0807 05/16/20 0824 05/16/20 0825  HGB 12.9* 12.9* 12.9*  HCT 38.0* 38.0* 38.0*    Estimated Creatinine Clearance: 50.8 mL/min (A) (by C-G formula based on SCr of 1.38 mg/dL (H)).   Medical History: Past Medical History:  Diagnosis Date  . AAA (abdominal aortic aneurysm) without rupture ([place]) 12/24/2013  . Barrett's esophagus   . Cardiomyopathy ([place]) 03/07/2018   EF 35% in 2017  . Chronic combined systolic and diastolic CHF (congestive heart failure) ([place]) 04/25/2018  . Coronary artery disease 03/07/2018   Left heart cath 2017:Conclusions Peripheral Procedure Description Patent stent graft in abdominal aorta Patent Renal artery stents Diagnostic Procedure Summary Diffuse Mild to Moderate calcific coronary artery disease. IFR in the RCA was 1.0 after hyperemic response with adenosine. Moderate global LV systolic dysfunction. LV ejection fraction is 35% Diagnostic Procedure Recommendations Medical the  . Diarrhea 04/28/2018  . Dyslipidemia 10/28/2015  . H/O aortic aneurysm repair 10/28/2015  . Hypertensive heart disease with heart failure ([place]) 03/07/2018  . LBBB (left bundle branch block) 04/25/2018   QRSD 180 msec  . Myocardial infarction [facility])      Assessment: 79yom admitted post cath for TCTS evaluation for CABG Heparin drip to start with new aflutter during cath  Goal of Therapy:  Heparin level 0.3-0.7 units/ml Monitor platelets by anticoagulation  protocol: Yes   Plan:  Heparin drip to start at 5pm (sheath removed 9am )  Heparin drip 1200 uts/hr - no bolus  Heparin level 6hr after start Daily heparin level and CBC    Bonnita Nasuti Pharm.D. CPP, BCPS Clinical Pharmacist [phone] 05/16/2020 12:54 PM

## 2020-05-16 NOTE — Progress Notes (Signed)
   05/16/20 1018  Assess: MEWS Score  Temp 97.7 F (36.5 C)  BP (!) 107/94  Pulse Rate 98  ECG Heart Rate (!) 112  Resp 20  Level of Consciousness Alert  SpO2 92 %  O2 Device Nasal Cannula  O2 Flow Rate (L/min) 2 L/min  Assess: MEWS Score  MEWS Temp 0  MEWS Systolic 0  MEWS Pulse 2  MEWS RR 0  MEWS LOC 0  MEWS Score 2  MEWS Score Color Yellow  Assess: if the MEWS score is Yellow or Red  Were vital signs taken at a resting state? Yes  Focused Assessment No change from prior assessment  Early Detection of Sepsis Score *See Row Information* Low  MEWS guidelines implemented *See Row Information* Yes  Treat  MEWS Interventions Escalated (See documentation below)  Pain Scale 0-10  Pain Score 0  Patients Stated Pain Goal 0  Take Vital Signs  Increase Vital Sign Frequency  Yellow: Q 2hr X 2 then Q 4hr X 2, if remains yellow, continue Q 4hrs  Escalate  MEWS: Escalate Yellow: discuss with charge nurse/RN and consider discussing with provider and RRT  Notify: Charge Nurse/RN  Name of Charge Nurse/RN Notified Elaina Pattee  Date Charge Nurse/RN Notified 05/16/20  Time Charge Nurse/RN Notified 1018  Document  Patient Outcome Other (Comment) (stable; remains on department)  Progress note created (see row info) Yes

## 2020-05-16 NOTE — Consult Note (Signed)
[address]       [place] [address]             [facility] Medical Record [id number] Date of Birth: December 10, 1940  Referring:[place] Primary Care: Garwin Brothers, MD Primary Cardiologist:Brian [name], MD  Chief Complaint:  CAD, CHF  History of Present Illness:      Glenn Sullivan is an 80 yo male with known history of AAA S/P Repair in 2017, HTN, Hyperlipidemia, Severe Cardiomyopathy, PAD, and CAD with CHF and reduced EF.  The patient has been followed by Dr. [name] since 2017.  He was last evaluated in February of 2021 at which time he was felt to be compensating for his CHF.  However the patient contacted his Cardiology office to be evaluated with a 1 month complaint of shortness of breath and chest discomfort.  The symptoms did not occur at rest, but would occur with minimal exertion.  He was evaluated by Dr. Harriet Masson in the office at which time EKG showed NSR with 1st degree AV Block and underlying LBBB.  It was felt his symptoms were concerning for possible angina.  It was felt cardiac catheterization would be indicated.  The patient was agreeable to proceed.  This was performed on 05/16/2020 and showed CAD with A. Flutter during the procedure.  It was felt cardiothoracic surgery would be indicated and TCTS consult has been requested.  Currently the patient is feeling better.  He states prior to the recent development of shortness of breath he was very functional and could do whatever he wanted.  He is a former smoker who quit in 2008.  He denies history of DM.  Current Activity/ Functional Status: Patient is independent with mobility/ambulation, transfers, ADL's, IADL's.   Zubrod Score: At the time of surgery this patient's most appropriate activity status/level should be described as: []     0    Normal activity, no symptoms [x]     1    Restricted in physical strenuous activity but ambulatory, able to do out light work []     2    Ambulatory  and capable of self care, unable to do work activities, up and about                 more than 50%  Of the time                            []     3    Only limited self care, in bed greater than 50% of waking hours []     4    Completely disabled, no self care, confined to bed or chair []     5    Moribund  Past Medical History:  Diagnosis Date  . AAA (abdominal aortic aneurysm) without rupture ([place]) 12/24/2013  . Dong Nimmons's esophagus   . Cardiomyopathy ([place]) 03/07/2018   EF 35% in 2017  . Chronic combined systolic and diastolic CHF (congestive heart failure) ([place]) 04/25/2018  . Coronary artery disease 03/07/2018   Left heart cath 2017:Conclusions Peripheral Procedure Description Patent stent graft in abdominal aorta Patent Renal artery stents Diagnostic Procedure Summary Diffuse Mild to Moderate calcific coronary artery disease. IFR in the RCA was 1.0 after hyperemic response with adenosine. Moderate global LV systolic dysfunction. LV ejection fraction is 35% Diagnostic Procedure Recommendations Medical the  . Diarrhea 04/28/2018  . Dyslipidemia 10/28/2015  .  H/O aortic aneurysm repair 10/28/2015  . Hypertensive heart disease with heart failure ([place]) 03/07/2018  . LBBB (left bundle branch block) 04/25/2018   QRSD 180 msec  . Myocardial infarction [facility])     Past Surgical History:  Procedure Laterality Date  . ABDOMINAL AORTIC ANEURYSM REPAIR    . EVAR    . RENAL ARTERY STENT      Social History   Tobacco Use  Smoking Status Former Smoker  . Types: Cigarettes  . Quit date: 01/16/2007  . Years since quitting: 13.3  Smokeless Tobacco Never Used    Social History   Substance and Sexual Activity  Alcohol Use Yes   Comment: occ     Allergies  Allergen Reactions  . Tetanus Toxoids Swelling    Swelling at sight  . Lisinopril Other (See Comments)    Current Facility-Administered Medications  Medication Dose Route Frequency Provider Last Rate Last Admin  . 0.9 %  sodium chloride  infusion   Intravenous Continuous Leonie Man, MD 75 mL/hr at 05/16/20 0926 New Bag at 05/16/20 0926  . 0.9 %  sodium chloride infusion  250 mL Intravenous PRN Leonie Man, MD      . acetaminophen (TYLENOL) tablet 650 mg  650 mg Oral Q4H PRN Leonie Man, MD      . Derrill Memo ON 05/17/2020] aspirin EC tablet 81 mg  81 mg Oral Daily Leonie Man, MD      . atorvastatin (LIPITOR) tablet 40 mg  40 mg Oral Daily Leonie Man, MD      . carvedilol (COREG) tablet 3.125 mg  3.125 mg Oral BID WC Leonie Man, MD      . fluticasone [facility]) 50 MCG/ACT nasal spray 1 spray  1 spray Each Nare Daily PRN Leonie Man, MD      . furosemide (LASIX) injection 40 mg  40 mg Intravenous Once Leonie Man, MD      . gabapentin (NEURONTIN) capsule 300 mg  300 mg Oral QHS PRN Leonie Man, MD      . hydrALAZINE (APRESOLINE) injection 10 mg  10 mg Intravenous Q20 Min PRN Leonie Man, MD      . labetalol (NORMODYNE) injection 10 mg  10 mg Intravenous Q10 min PRN Leonie Man, MD      . ondansetron [facility]) injection 4 mg  4 mg Intravenous Q6H PRN Leonie Man, MD      . pantoprazole (PROTONIX) EC tablet 40 mg  40 mg Oral Daily Leonie Man, MD      . sacubitril-valsartan (ENTRESTO) 49-51 mg per tablet  1 tablet Oral BID Leonie Man, MD      . sodium chloride flush (NS) 0.9 % injection 3 mL  3 mL Intravenous Q12H Leonie Man, MD      . sodium chloride flush (NS) 0.9 % injection 3 mL  3 mL Intravenous PRN Leonie Man, MD      . Derrill Memo ON 05/17/2020] torsemide [facility]) tablet 40 mg  40 mg Oral Daily Leonie Man, MD        Medications Prior to Admission  Medication Sig Dispense Refill Last Dose  . aspirin EC 81 MG tablet Take 81 mg by mouth daily.   05/16/2020 at 0607  . atorvastatin (LIPITOR) 40 MG tablet Take 1 tablet (40 mg total) by mouth daily. 90 tablet 0 05/15/2020 at Unknown time  . fluticasone (FLONASE) 50 MCG/ACT nasal spray Place 1  spray into both  nostrils daily as needed for allergies.   Past Week at Unknown time  . gabapentin (NEURONTIN) 300 MG capsule Take 300 mg by mouth at bedtime as needed (sleep).   05/15/2020 at Unknown time  . omeprazole (PRILOSEC) 20 MG capsule Take 20 mg by mouth daily.   05/15/2020 at Unknown time  . sacubitril-valsartan (ENTRESTO) 49-51 MG Take 1 tablet by mouth 2 (two) times daily.   Past Week at Unknown time  . torsemide (DEMADEX) 20 MG tablet TAKE 1 TABLET BY MOUTH DAILY, EXCEPT 2 TABLETS ON MONDAY, WEDNESDAY, AND FRIDAY (Patient taking differently: Take 20-40 mg by mouth See admin instructions. Take 20 mg by mouth on Tuesday, Thursday, Saturday and Sunday and take 40 mg on Monday, Wednesday and Friday) 126 tablet 1 05/15/2020 at Unknown time    Family History  Problem Relation Age of Onset  . Diabetes Mother   . Hypertension Mother   . Varicose Veins Mother   . Heart disease Mother        before age 41    Review of Systems:     Cardiac Review of Systems: Y or  [    ]= no  Chest Pain [  N  ]  Resting SOB [ N  ] Exertional SOB  [Y  ]  Orthopnea [  ]   Pedal Edema [ N  ]    Palpitations [ Y ] Syncope  [  ]   Presyncope [   ]  General Review of Systems: [Y] = yes [  ]=no Constitional: recent weight change Aqua.Slicker  ]; anorexia [  ]; fatigue [  ]; nausea [  ]; night sweats [  ]; fever [  ]; or chills [  ]                                                               Dental: Last Dentist visit:   Eye : blurred vision [  ]; diplopia [   ]; vision changes [  ];  Amaurosis fugax[  ]; Resp: cough Aqua.Slicker  ];  wheezing[  ];  hemoptysis[  ]; shortness of breath[  ]; paroxysmal nocturnal dyspnea[  ]; dyspnea on exertion[Y  ]; or orthopnea[  ];  GI:  gallstones[  ], vomiting[  ];  dysphagia[  ]; melena[  ];  hematochezia [  ]; heartburn[  ];   Hx of  Colonoscopy[  ]; GU: kidney stones [  ]; hematuria[  ];   dysuria [  ];  nocturia[  ];  history of     obstruction [  ]; urinary frequency [  ]             Skin: rash, swelling[N   ];, hair loss[  ];  peripheral edema[ N ];  or itching[  ]; Musculosketetal: myalgias[  ];  joint swelling[  ];  joint erythema[  ];  joint pain[  ];  back pain[  ];  Heme/Lymph: bruising[  ];  bleeding[  ];  anemia[  ];  Neuro: TIA[  ];  headaches[N  ];  stroke[N  ];  vertigo[  ];  seizures[  ];   paresthesias[  ];  difficulty walking[ N ];  Psych:depression[  ]; anxiety[  ];  Endocrine:  diabetes[ N ];  thyroid dysfunction[N  ];  Physical Exam: BP (!) 107/94 (BP Location: Left Arm)   Pulse 98   Temp 97.7 F (36.5 C) (Oral)   Resp 20   Ht 6' (1.829 m)   Wt 94.1 kg   SpO2 92%   BMI 28.13 kg/m   General appearance: alert, cooperative and no distress Neck: no adenopathy, no carotid bruit, no JVD, supple, symmetrical, trachea midline and thyroid not enlarged, symmetric, no tenderness/mass/nodules Resp: clear to auscultation bilaterally Cardio: irregularly irregular rhythm GI: soft, non-tender; bowel sounds normal; no masses,  no organomegaly Extremities: edema trace, varicosities in BLE Neurologic: Grossly normal  Diagnostic Studies & Laboratory data:     Recent Radiology Findings:   CARDIAC CATHETERIZATION  Result Date: 05/16/2020  Ost LM to Mid LM lesion is 30% stenosed.  Mid LM to Dist LM lesion is 55% stenosed.  Prox Cx lesion is 30% stenosed.  Ost RCA lesion is 55% stenosed.  -------------------------------------------------  LV end diastolic pressure is moderately elevated.  Hemodynamic findings consistent with moderate pulmonary hypertension.  SUMMARY  Heavily calcified left main diffuse disease most prominently 50% distal just prior to trifurcation into LAD, RI and LCx.  Mild diffuse disease in the LCA after Left Main  Calcified Ostial RCA 50 to 60% with Otherwise Mild Diffuse Disease.   Moderate Secondary Pulmonary Hypertension with PAP 49/24 mmHg-mean 36 mmHg; PCWP (and LVEDP) 22 mmHg; RAP 12 mmHg.  Stable Cardiac output 4.9, with Index of 2.3.  Apparent atrial  flutter on monitor RECOMMENDATIONS  Given advanced age with distal left main calcified disease, EDP of 22, relatively rapid apparent atrial flutter, I feel that is safest for the patient to be evaluated with CVTS consult while in the hospital.   We will gently diurese him today, and start IV heparin for anticoagulation given atrial flutter.  Will also add low-dose beta-blocker.  Continue home medications -> titrate CHF medications as able. [name], MD    I have independently reviewed the above radiologic studies and discussed with the patient   Recent Lab Findings: Lab Results  Component Value Date   WBC 8.0 05/11/2020   HGB 12.9 (L) 05/16/2020   HCT 38.0 (L) 05/16/2020   PLT 227 05/11/2020   GLUCOSE 104 (H) 05/11/2020   CHOL 165 11/24/2018   TRIG 67 11/24/2018   HDL 77 11/24/2018   LDLCALC 75 11/24/2018   ALT 17 06/19/2018   AST 23 06/19/2018   NA 140 05/16/2020   K 3.9 05/16/2020   CL 100 05/11/2020   CREATININE 1.38 (H) 05/11/2020   BUN 24 05/11/2020   CO2 25 05/11/2020      Assessment / Plan:      1. CAD- consult for CABG requested, low EF- on Entresto as outpatient 2. Acute on chronic CHF- presenting symptoms, diuretics ordered 3. PVD 4. Dispo- patient stable, acute on chronic CHF, currently being diuresed, on heparin for A. Flutter, requesting CABG consult, Dr. Orvan Seen will evaluate patient and follow up with recommendations  I  spent 55 minutes counseling the patient face to face.   Glenn Birkhead, PA-C 05/16/2020 11:13 AM

## 2020-05-16 NOTE — Progress Notes (Signed)
TCTS consulted for CABG evaluation. °

## 2020-05-16 NOTE — Interval H&P Note (Signed)
History and Physical Interval Note:  05/16/2020 7:49 AM  Foye Clock  has presented today for surgery, with the diagnosis of cad - sob.  The various methods of treatment have been discussed with the patient and family. After consideration of risks, benefits and other options for treatment, the patient has consented to  Procedure(s): RIGHT/LEFT HEART CATH AND CORONARY ANGIOGRAPHY (N/A)  PERCUTANEOUS CORONARY INTERVENTION  as a surgical intervention.  The patient's history has been reviewed, patient examined, no change in status, stable for surgery.  I have reviewed the patient's chart and labs.  Questions were answered to the patient's satisfaction.    Cath Lab Visit (complete for each Cath Lab visit)  Clinical Evaluation Leading to the Procedure:   ACS: No.  Non-ACS:    Anginal Classification: No Symptoms; NYHA Class II-III  Anti-ischemic medical therapy: Minimal Therapy (1 class of medications)  Non-Invasive Test Results: High-risk stress test findings: cardiac mortality >3%/year; EF 20-25%  Prior CABG: No previous CABG    [name]

## 2020-05-17 ENCOUNTER — Observation Stay (HOSPITAL_BASED_OUTPATIENT_CLINIC_OR_DEPARTMENT_OTHER): Payer: Medicare Other

## 2020-05-17 ENCOUNTER — Other Ambulatory Visit: Payer: Self-pay | Admitting: Cardiology

## 2020-05-17 DIAGNOSIS — Z79899 Other long term (current) drug therapy: Secondary | ICD-10-CM | POA: Diagnosis not present

## 2020-05-17 DIAGNOSIS — Z01810 Encounter for preprocedural cardiovascular examination: Secondary | ICD-10-CM

## 2020-05-17 DIAGNOSIS — I25119 Atherosclerotic heart disease of native coronary artery with unspecified angina pectoris: Secondary | ICD-10-CM | POA: Diagnosis not present

## 2020-05-17 DIAGNOSIS — I5043 Acute on chronic combined systolic (congestive) and diastolic (congestive) heart failure: Secondary | ICD-10-CM | POA: Diagnosis not present

## 2020-05-17 DIAGNOSIS — I484 Atypical atrial flutter: Secondary | ICD-10-CM

## 2020-05-17 DIAGNOSIS — Z87891 Personal history of nicotine dependence: Secondary | ICD-10-CM | POA: Diagnosis not present

## 2020-05-17 DIAGNOSIS — I110 Hypertensive heart disease with heart failure: Secondary | ICD-10-CM | POA: Diagnosis not present

## 2020-05-17 DIAGNOSIS — I447 Left bundle-branch block, unspecified: Secondary | ICD-10-CM | POA: Diagnosis not present

## 2020-05-17 DIAGNOSIS — Z7982 Long term (current) use of aspirin: Secondary | ICD-10-CM | POA: Diagnosis not present

## 2020-05-17 DIAGNOSIS — I455 Other specified heart block: Secondary | ICD-10-CM

## 2020-05-17 LAB — BASIC METABOLIC PANEL
Anion gap: 9 (ref 5–15)
BUN: 18 mg/dL (ref 8–23)
CO2: 26 mmol/L (ref 22–32)
Calcium: 8.9 mg/dL (ref 8.9–10.3)
Chloride: 102 mmol/L (ref 98–111)
Creatinine, Ser: 1.25 mg/dL — ABNORMAL HIGH (ref 0.61–1.24)
GFR, Estimated: 58 mL/min — ABNORMAL LOW (ref >60)
Glucose, Bld: 103 mg/dL — ABNORMAL HIGH (ref 70–99)
Potassium: 4 mmol/L (ref 3.5–5.1)
Sodium: 137 mmol/L (ref 135–145)

## 2020-05-17 LAB — CBC
HCT: 40.4 % (ref 39.0–52.0)
Hemoglobin: 13.5 g/dL (ref 13.0–17.0)
MCH: 32.1 pg (ref 26.0–34.0)
MCHC: 33.4 g/dL (ref 30.0–36.0)
MCV: 96.2 fL (ref 80.0–100.0)
Platelets: 181 10*3/uL (ref 150–400)
RBC: 4.2 MIL/uL — ABNORMAL LOW (ref 4.22–5.81)
RDW: 12.7 % (ref 11.5–15.5)
WBC: 8.2 10*3/uL (ref 4.0–10.5)
nRBC: 0 % (ref 0.0–0.2)

## 2020-05-17 LAB — HEPARIN LEVEL (UNFRACTIONATED)
Heparin Unfractionated: <0.1 IU/mL — ABNORMAL LOW (ref 0.30–0.70)
Heparin Unfractionated: 0.33 IU/mL (ref 0.30–0.70)

## 2020-05-17 MED ORDER — TORSEMIDE 40 MG PO TABS: 40.0000 mg | ORAL_TABLET | Freq: Every day | ORAL | 2 refills | Status: DC

## 2020-05-17 MED ORDER — CARVEDILOL 3.125 MG PO TABS: 3.1250 mg | ORAL_TABLET | Freq: Two times a day (BID) | ORAL | 2 refills | Status: DC

## 2020-05-17 MED ORDER — DIGOXIN 125 MCG PO TABS: 0.0625 mg | ORAL_TABLET | Freq: Every day | ORAL | 1 refills | Status: DC

## 2020-05-17 MED ORDER — APIXABAN 5 MG PO TABS: 5.0000 mg | ORAL_TABLET | Freq: Two times a day (BID) | ORAL | 3 refills | Status: DC

## 2020-05-17 MED ORDER — APIXABAN 5 MG PO TABS
5.0000 mg | ORAL_TABLET | Freq: Two times a day (BID) | ORAL | Status: DC
  Administered 2020-05-17: 5 mg via ORAL
  Filled 2020-05-17: qty 1

## 2020-05-17 NOTE — Progress Notes (Signed)
CR ordered by TCTS however note that pt is now declining surgery. IF CR services warranted, please place new order.  Yves Dill CES, ACSM 10:51 AM 05/17/2020

## 2020-05-17 NOTE — Progress Notes (Signed)
Heart Failure Nurse Navigator Progress Note  Pt screened for HV TOC readiness. Pt declined HV TOC clinic appt. Pt has declined inpt DCCV and CABG workup.   Navigator available for reassessment as needed.   Pricilla Holm, RN, BSN Heart Failure Nurse Navigator [phone]

## 2020-05-17 NOTE — TOC Benefit Eligibility Note (Signed)
Transition of Care [facility]) Benefit Eligibility Note    Patient Details  Name: Glenn Sullivan MRN: [id number] Date of Birth: [birth year]   Medication/Dose: Eliquis 5mg . bid for 30 day supply  Covered?: Yes  Tier:  (?)  Prescription Coverage [facility]: [facility]  Spoke with Person/Company/Phone Number:: Per [name] W/[facility] [employer],Ph# [phone]  Co-Pay: $8.82  Prior Approval: No  Deductible:  (no -deductible)       Shelda Altes Phone Number: 05/17/2020, 11:45 AM

## 2020-05-17 NOTE — Progress Notes (Signed)
   05/16/20 2111  Assess: MEWS Score  Temp 97.7 F (36.5 C)  BP (!) 108/95  Pulse Rate (!) 103  ECG Heart Rate (!) 121  Resp 20  Level of Consciousness Alert  SpO2 92 %  O2 Device Room Air  Patient Activity (if Appropriate) In bed  Assess: MEWS Score  MEWS Temp 0  MEWS Systolic 0  MEWS Pulse 2  MEWS RR 0  MEWS LOC 0  MEWS Score 2  MEWS Score Color Yellow  Assess: if the MEWS score is Yellow or Red  Were vital signs taken at a resting state? Yes  Focused Assessment No change from prior assessment  Early Detection of Sepsis Score *See Row Information* Low  MEWS guidelines implemented *See Row Information* Yes  Treat  MEWS Interventions Administered scheduled meds/treatments  Pain Scale 0-10  Pain Score 0  Patients Stated Pain Goal 0  Pain Intervention(s) Rest  Take Vital Signs  Increase Vital Sign Frequency  Yellow: Q 2hr X 2 then Q 4hr X 2, if remains yellow, continue Q 4hrs  Escalate  MEWS: Escalate Yellow: discuss with charge nurse/RN and consider discussing with provider and RRT  Notify: Charge Nurse/RN  Name of Charge Nurse/RN Notified Glenna Durand, RN  Date Charge Nurse/RN Notified 05/16/20  Time Charge Nurse/RN Notified 2115

## 2020-05-17 NOTE — Progress Notes (Incomplete)
Carotid duplex has been completed.   Results can be found under chart review under CV PROC. 05/17/2020 10:46 AM Margree Gimbel RVT, RDMS

## 2020-05-17 NOTE — Discharge Summary (Addendum)
The patient has been seen in conjunction with Glenn Bellis, NP. All aspects of care have been considered and discussed. The patient has been personally interviewed, examined, and all clinical data has been reviewed.  Please see the attestation in my earlier note. Patient preferred to be discharged and follow-up with Dr. Bettina Sullivan. There is some measure of risk in this approach and discussed frankly with wife and Mr. Glenn Sullivan. Contents of this note reviewed and are accurate.    Discharge Summary    Patient ID: Glenn Sullivan MRN: [id number]; DOB: Feb 28, 1940  Admit date: 05/16/2020 Discharge date: 05/17/2020  PCP:  Glenn Brothers, MD   [facility] Providers Cardiologist:  Glenn More, MD     Discharge Diagnoses    Principal Problem:   Acute on chronic combined systolic and diastolic CHF (congestive heart failure) ([place]) Active Problems:   Cardiomyopathy ([place])   LBBB (left bundle branch block)   Coronary artery disease involving native coronary artery of native heart with angina pectoris ([place])   Unspecified atrial flutter ([place])   Left main coronary artery disease   Dyspnea on exertion    Diagnostic Studies/Procedures    Cath: 05/16/20  Ost LM to Mid LM lesion is 30% stenosed. Mid LM to Dist LM lesion is 55% stenosed. Prox Cx lesion is 30% stenosed. Ost RCA lesion is 55% stenosed. ------------------------------------------------- LV end diastolic pressure is moderately elevated. Hemodynamic findings consistent with moderate pulmonary hypertension.   SUMMARY Heavily calcified left main diffuse disease most prominently 50% distal just prior to trifurcation into LAD, RI and LCx. Mild diffuse disease in the LCA after Left Main Calcified Ostial RCA 50 to 60% with Otherwise Mild Diffuse Disease.   Moderate Secondary Pulmonary Hypertension with PAP 49/24 mmHg-mean 36 mmHg; PCWP (and LVEDP) 22 mmHg; RAP 12 mmHg. Stable Cardiac output 4.9, with Index of 2.3. Apparent atrial flutter  on monitor     RECOMMENDATIONS Given advanced age with distal left main calcified disease, EDP of 22, relatively rapid apparent atrial flutter, I feel that is safest for the patient to be evaluated with CVTS consult while in the hospital.   We will gently diurese him today, and start IV heparin for anticoagulation given atrial flutter. Will also add low-dose beta-blocker. Continue home medications -> titrate CHF medications as able.    [name], MD  Diagnostic Dominance: Right     _____________   History of Present Illness     Glenn Sullivan is a 80 y.o. male with PMH of mild to moderate CAD, Chronic systolic HF, HTN, HLD and PAD who is followed by Dr. Bettina Sullivan as an outpatient. Most recently seen in the office on 4/27 with Dr. Harriet Sullivan 2/2 shortness of breath. Reported increasing dyspnea on exertion. Developed episodes with minimal activity. Was referred for outpatient cardiac cath.   Hospital Course     Consultants: TCTS  1. CAD: Underwent cardiac cath noted above with 30% mid LM, 55% distal LM, 55% ostial RCA, 30% pLCx. Given advanced age with distal LM and elevated LVEDP, recommendation for TCTS while inpatient. He was seen by TCTS PA but adamantly stated he did not wish to have CABG.  -- continued ASA, statin, Entresto, coreg   2. Acute on Chronic HF: known EF of 20-25%, elevated LVEDP on cath. Diuresed with IV lasix post cath. No significant volume overload on exam on day of discharge.   -- started on coreg post cath and Entresto PTA, blood pressures were soft with the addition of coreg. Will  defer spiro at this time.  -- torsemide increased to 40mg  daily   3. New onset Atrial flutter/atrial tachycardia: noted while in the cath lab. CHA2DS2-VASc Score of 4.  -- placed on IV heparin pending TCTS consult, but transitioned to Eliquis 5mg  prior to discharge. Heart rates were elevated with ambulation, therefore digoxin 0.0625mg  daily was added prior to discharge. --plan for 3  weeks of anticoagulation with follow up in the office to discuss cardioversion if remains in atrial flutter/fib as he was not willing to consider inpatient TEE/DCCV   4. HTN: stable with coreg and Entresto, unable to further titrate at this time due to soft blood pressures   5. HLD: on statin  6. Sinus Pause with known AV conduction delay: called prior to patient discharge with concern for pause on telemetry. In review, could be related to artifact, but reading dose measure about 3.5 seconds. Given the addition of BB therapy and dig will plan for outpatient cardiac monitor.   Did the patient have an acute coronary syndrome (MI, NSTEMI, STEMI, etc) this admission?:  No                               Did the patient have a percutaneous coronary intervention (stent / angioplasty)?:  No.       _____________  Discharge Vitals Blood pressure 101/78, pulse 75, temperature 97.6 F (36.4 C), temperature source Oral, resp. rate 18, height 6' (1.829 m), weight 92 kg, SpO2 96 %.  Filed Weights   05/16/20 0551 05/16/20 1018 05/17/20 0502  Weight: 90.7 kg 94.1 kg 92 kg    Labs & Radiologic Studies    CBC Recent Labs    05/16/20 0825 05/17/20 0736  WBC  --  8.2  HGB 12.9* 13.5  HCT 38.0* 40.4  MCV  --  96.2  PLT  --  299   Basic Metabolic Panel Recent Labs    05/16/20 0825 05/17/20 0736  NA 140 137  K 3.9 4.0  CL  --  102  CO2  --  26  GLUCOSE  --  103*  BUN  --  18  CREATININE  --  1.25*  CALCIUM  --  8.9   Liver Function Tests No results for input(s): AST, ALT, ALKPHOS, BILITOT, PROT, ALBUMIN in the last 72 hours. No results for input(s): LIPASE, AMYLASE in the last 72 hours. High Sensitivity Troponin:   No results for input(s): TROPONINIHS in the last 720 hours.  BNP Invalid input(s): POCBNP D-Dimer No results for input(s): DDIMER in the last 72 hours. Hemoglobin A1C No results for input(s): HGBA1C in the last 72 hours. Fasting Lipid Panel No results for input(s):  CHOL, HDL, LDLCALC, TRIG, CHOLHDL, LDLDIRECT in the last 72 hours. Thyroid Function Tests No results for input(s): TSH, T4TOTAL, T3FREE, THYROIDAB in the last 72 hours.  Invalid input(s): FREET3 _____________  CARDIAC CATHETERIZATION  Result Date: 05/16/2020  Ost LM to Mid LM lesion is 30% stenosed.  Mid LM to Dist LM lesion is 55% stenosed.  Prox Cx lesion is 30% stenosed.  Ost RCA lesion is 55% stenosed.  -------------------------------------------------  LV end diastolic pressure is moderately elevated.  Hemodynamic findings consistent with moderate pulmonary hypertension.  SUMMARY  Heavily calcified left main diffuse disease most prominently 50% distal just prior to trifurcation into LAD, RI and LCx.  Mild diffuse disease in the LCA after Left Main  Calcified Ostial RCA 50 to 60% with  Otherwise Mild Diffuse Disease.   Moderate Secondary Pulmonary Hypertension with PAP 49/24 mmHg-mean 36 mmHg; PCWP (and LVEDP) 22 mmHg; RAP 12 mmHg.  Stable Cardiac output 4.9, with Index of 2.3.  Apparent atrial flutter on monitor RECOMMENDATIONS  Given advanced age with distal left main calcified disease, EDP of 22, relatively rapid apparent atrial flutter, I feel that is safest for the patient to be evaluated with CVTS consult while in the hospital.   We will gently diurese him today, and start IV heparin for anticoagulation given atrial flutter.  Will also add low-dose beta-blocker.  Continue home medications -> titrate CHF medications as able. [name], MD  VAS US CAROTID  Result Date: 05/17/2020 Carotid Arterial Duplex Study Patient Name:  DARIUS DAY  Date of Exam:   05/17/2020 Medical Rec #: [id number]       Accession #:    [id number] Date of Birth: [birth year]        Patient Gender: M Patient Age:   79Y Exam Location:  [facility] Procedure:      VAS US CAROTID Referring Phys: [id number] [place] --------------------------------------------------------------------------------   Indications:       PRE-OP. Risk Factors:      Hypertension, hyperlipidemia, past history of smoking, prior                    MI, coronary artery disease. Other Factors:     Atrial-flutter, CHF, AAA post EVAR, PVD. Comparison Study:  Previous exam 06/27/2012 - WNL Performing Technologist: Rogelia Rohrer RVT, RDMS  Examination Guidelines: A complete evaluation includes B-mode imaging, spectral Doppler, color Doppler, and power Doppler as needed of all accessible portions of each vessel. Bilateral testing is considered an integral part of a complete examination. Limited examinations for reoccurring indications may be performed as noted.  Right Carotid Findings: +----------+-------+-------+--------+-----------------------+------------------+           PSV    EDV    StenosisPlaque Description     Comments                     cm/s   cm/s                                                     +----------+-------+-------+--------+-----------------------+------------------+ CCA Prox  46     8                                     intimal thickening +----------+-------+-------+--------+-----------------------+------------------+ CCA Distal33     9              calcific and           intimal thickening                                 heterogenous                              +----------+-------+-------+--------+-----------------------+------------------+ ICA Prox  50     15     1-39%   calcific and  heterogenous                              +----------+-------+-------+--------+-----------------------+------------------+ ICA Distal46     12                                    Not well                                                                  visualized         +----------+-------+-------+--------+-----------------------+------------------+ ECA       60     5                                                         +----------+-------+-------+--------+-----------------------+------------------+ +----------+--------+-------+----------------+-------------------+           PSV cm/sEDV cmsDescribe        Arm Pressure (mmHG) +----------+--------+-------+----------------+-------------------+ Subclavian117            Multiphasic, WNL                    +----------+--------+-------+----------------+-------------------+ +---------+--------+--+--------+--+---------+ VertebralPSV cm/s67EDV cm/s11Antegrade +---------+--------+--+--------+--+---------+  Left Carotid Findings: +----------+-------+--------+--------+-----------------------+-----------------+           PSV    EDV cm/sStenosisPlaque Description     Comments                    cm/s                                                            +----------+-------+--------+--------+-----------------------+-----------------+ CCA Prox  40     7                                      intimal                                                                   thickening        +----------+-------+--------+--------+-----------------------+-----------------+ CCA Distal43     7               heterogenous           intimal  thickening        +----------+-------+--------+--------+-----------------------+-----------------+ ICA Prox  71     14      1-39%   heterogenous and       tortuous                                           calcific                                 +----------+-------+--------+--------+-----------------------+-----------------+ ICA Distal73     17                                                       +----------+-------+--------+--------+-----------------------+-----------------+ ECA       58     0                                                         +----------+-------+--------+--------+-----------------------+-----------------+ +----------+--------+--------+--------+-------------------+           PSV cm/sEDV cm/sDescribeArm Pressure (mmHG) +----------+--------+--------+--------+-------------------+ Subclavian231             Stenotic                    +----------+--------+--------+--------+-------------------+ +---------+--------+--+--------+-+----------+ VertebralPSV cm/s48EDV cm/s9Retrograde +---------+--------+--+--------+-+----------+   Summary: Right Carotid: Velocities in the right ICA are consistent with a 1-39% stenosis.                Non-hemodynamically significant plaque <50% noted in the CCA. The                ECA appears <50% stenosed. Left Carotid: Velocities in the left ICA are consistent with a 1-39% stenosis.               Non-hemodynamically significant plaque <50% noted in the CCA. The               ECA appears <50% stenosed. Vertebrals:  Right vertebral artery demonstrates antegrade flow. Left vertebral              artery demonstrates retrograde flow. Subclavians: Left subclavian artery was stenotic. Normal flow hemodynamics were              seen in bilateral subclavian arteries. *See table(s) above for measurements and observations.     Preliminary    Disposition   Pt is being discharged home today in good condition.  Follow-up Plans & Appointments     Follow-up Information     Constance Haw, MD Follow up on 05/30/2020.   Specialty: Cardiology Why: @11am  for your EP appt Contact information: [place] [address] [phone]         [facility] [place] Follow up on 05/23/2020.   Specialty: Cardiology Why: Please come in for follow up labs. Lab hours are 8-4:30pm, closed from 12-1 for lunch Contact information: [place] [phone]  Discharge Instructions     Call MD for:  difficulty  breathing, headache or visual disturbances   Complete by: As directed    Call MD for:  persistant dizziness or light-headedness   Complete by: As directed    Diet - low sodium heart healthy   Complete by: As directed    Discharge instructions   Complete by: As directed    If you notice any bleeding such as blood in stool, black tarry stools, blood in urine, nosebleeds or any other unusual bleeding, call your doctor immediately. It is not normal to have this kind of bleeding while on a blood thinner and usually indicates there is an underlying problem with one of your body systems that needs to be checked out.   Increase activity slowly   Complete by: As directed        Discharge Medications   Allergies as of 05/17/2020       Reactions   Tetanus Toxoids Swelling   Swelling at sight   Lisinopril Other (See Comments)        Medication List     TAKE these medications    apixaban 5 MG Tabs tablet Commonly known as: ELIQUIS Take 1 tablet (5 mg total) by mouth 2 (two) times daily.   aspirin EC 81 MG tablet Take 81 mg by mouth daily.   atorvastatin 40 MG tablet Commonly known as: LIPITOR Take 1 tablet (40 mg total) by mouth daily.   carvedilol 3.125 MG tablet Commonly known as: COREG Take 1 tablet (3.125 mg total) by mouth 2 (two) times daily with a meal.   digoxin 0.125 MG tablet Commonly known as: Lanoxin Take 0.5 tablets (0.0625 mg total) by mouth daily.   fluticasone 50 MCG/ACT nasal spray Commonly known as: FLONASE Place 1 spray into both nostrils daily as needed for allergies.   gabapentin 300 MG capsule Commonly known as: NEURONTIN Take 300 mg by mouth at bedtime as needed (sleep).   omeprazole 20 MG capsule Commonly known as: PRILOSEC Take 20 mg by mouth daily.   sacubitril-valsartan 49-51 MG Commonly known as: ENTRESTO Take 1 tablet by mouth 2 (two) times daily.   Torsemide 40 MG Tabs Take 40 mg by mouth daily. Start taking on: May 18, 2020 What  changed:  medication strength See the new instructions.          Outstanding Labs/Studies   BMET/Dig 5/9  Duration of Discharge Encounter   Greater than 30 minutes including physician time.  Signed, Glenn Bellis, NP 05/17/2020, 12:56 PM

## 2020-05-17 NOTE — Progress Notes (Signed)
[facility] for heparin  Indication: chest pain/ACS and atrial fibrillation  Labs: Recent Labs    05/16/20 0807 05/16/20 0824 05/16/20 0825 05/16/20 2321  HGB 12.9* 12.9* 12.9*  --   HCT 38.0* 38.0* 38.0*  --   HEPARINUNFRC  --   --   --  <0.10*     Assessment: 79yom admitted post cath for TCTS evaluation for CABG Heparin level <0.1 units/ml  Goal of Therapy:  Heparin level 0.3-0.7 units/ml Monitor platelets by anticoagulation protocol: Yes   Plan:  Increase heparin drip to 1400 units/hr Heparin level 6-8 hours Daily heparin level and CBC   Thanks for allowing pharmacy to be a part of this patient's care.  Excell Seltzer, PharmD Clinical Pharmacist 05/17/2020 12:10 AM

## 2020-05-17 NOTE — Progress Notes (Addendum)
 The patient has been seen in conjunction with Lindsay Roberts, NP. All aspects of care have been considered and discussed. The patient has been personally interviewed, examined, and all clinical data has been reviewed.   Chronic systolic heart failure with exacerbation.  Greater than a liter negative after IV diuresis.  Feels some better but has not ambulated.  Recommend torsemide 40 mg daily.  Bmet in 5 days.  Follow-up with Dr. Munley ASAP.  New atrial flutter/tachycardia with 3-1 AV conduction.  When ambulatory heart rate control diminishes.  Currently on low-dose beta-blocker therapy.  May need to add digoxin as an outpatient if heart rate increases significantly with physical activity.  Will need early electrical cardioversion.  He is not willing to consider TEE cardioversion.  Prefers to go home and have cardioversion as an outpatient if it becomes necessary.  With progression of left main disease and ostial RCA disease.  Exertional chest tightness is likely ischemia related.  He is not willing to consider bypass surgery until he has spoken with Dr. Munley.  Left bundle branch block 196 ms with decreased LV systolic function would suggest that synchronization therapy would be helpful.  If ambulates and we have reasonable rate control, okay to discharge home later today.  If poor rate control would recommend adding digoxin 0.0625 mg daily.  Dig level should be done at the same time as be met in 5 days.   Progress Note  Patient Name: Glenn Sullivan Date of Encounter: 05/17/2020  [facility] Cardiologist: Brian Munley, MD   Subjective   No chest pain, sitting up in bed. Says he talked with Dr. Atkins and is not interested in surgery. States he is going home today, does not wish to remain in the hospital.   Inpatient Medications    Scheduled Meds: . aspirin EC  81 mg Oral Daily  . atorvastatin  40 mg Oral Daily  . carvedilol  3.125 mg Oral BID WC  . pantoprazole  40 mg Oral  Daily  . sacubitril-valsartan  1 tablet Oral BID  . sodium chloride flush  3 mL Intravenous Q12H  . torsemide  40 mg Oral Daily   Continuous Infusions: . sodium chloride    . heparin 1,400 Units/hr (05/17/20 0012)   PRN Meds: sodium chloride, acetaminophen, fluticasone, gabapentin, ondansetron (ZOFRAN) IV, sodium chloride flush   Vital Signs    Vitals:   05/16/20 2026 05/16/20 2111 05/16/20 2245 05/17/20 0502  BP: (!) 122/94 (!) 108/95 109/85 107/81  Pulse: 100 (!) 103 86 90  Resp: 20 20 20 18  Temp: (!) 97.5 F (36.4 C) 97.7 F (36.5 C) 97.7 F (36.5 C) 97.7 F (36.5 C)  TempSrc: Oral Oral Oral Oral  SpO2: 94% 92% 92% 95%  Weight:    92 kg  Height:        Intake/Output Summary (Last 24 hours) at 05/17/2020 0747 Last data filed at 05/17/2020 0500 Gross per 24 hour  Intake 2181.5 ml  Output 3300 ml  Net -1118.5 ml   Last 3 Weights 05/17/2020 05/16/2020 05/16/2020  Weight (lbs) 202 lb 14.4 oz 207 lb 6.4 oz 200 lb  Weight (kg) 92.035 kg 94.076 kg 90.719 kg      Telemetry    Atrial flutter as well as Fib rates 80-100s - Personally Reviewed  ECG    No new tracing.   Physical Exam   GEN: No acute distress.   Neck: No JVD Cardiac: Irreg Irreg, no murmurs, rubs, or gallops.    Respiratory: Clear to auscultation bilaterally. GI: Soft, nontender, non-distended  MS: No edema; No deformity. Right radial cath site stable.  Neuro:  Nonfocal  Psych: Normal affect   Labs    High Sensitivity Troponin:  No results for input(s): TROPONINIHS in the last 720 hours.    Chemistry Recent Labs  Lab 05/11/20 1058 05/16/20 0807 05/16/20 0824 05/16/20 0825  NA 139 139 140 140  K 4.8 3.9 3.9 3.9  CL 100  --   --   --   CO2 25  --   --   --   GLUCOSE 104*  --   --   --   BUN 24  --   --   --   CREATININE 1.38*  --   --   --   CALCIUM 9.3  --   --   --      Hematology Recent Labs  Lab 05/11/20 1058 05/16/20 0807 05/16/20 0824 05/16/20 0825  WBC 8.0  --   --   --   RBC  4.50  --   --   --   HGB 14.2 12.9* 12.9* 12.9*  HCT 41.2 38.0* 38.0* 38.0*  MCV 92  --   --   --   MCH 31.6  --   --   --   MCHC 34.5  --   --   --   RDW 11.7  --   --   --   PLT 227  --   --   --     BNP Recent Labs  Lab 05/11/20 1058  PROBNP 2,980*     DDimer No results for input(s): DDIMER in the last 168 hours.   Radiology    CARDIAC CATHETERIZATION  Result Date: 05/16/2020  Ost LM to Mid LM lesion is 30% stenosed.  Mid LM to Dist LM lesion is 55% stenosed.  Prox Cx lesion is 30% stenosed.  Ost RCA lesion is 55% stenosed.  -------------------------------------------------  LV end diastolic pressure is moderately elevated.  Hemodynamic findings consistent with moderate pulmonary hypertension.  SUMMARY  Heavily calcified left main diffuse disease most prominently 50% distal just prior to trifurcation into LAD, RI and LCx.  Mild diffuse disease in the LCA after Left Main  Calcified Ostial RCA 50 to 60% with Otherwise Mild Diffuse Disease.   Moderate Secondary Pulmonary Hypertension with PAP 49/24 mmHg-mean 36 mmHg; PCWP (and LVEDP) 22 mmHg; RAP 12 mmHg.  Stable Cardiac output 4.9, with Index of 2.3.  Apparent atrial flutter on monitor RECOMMENDATIONS  Given advanced age with distal left main calcified disease, EDP of 22, relatively rapid apparent atrial flutter, I feel that is safest for the patient to be evaluated with CVTS consult while in the hospital.   We will gently diurese him today, and start IV heparin for anticoagulation given atrial flutter.  Will also add low-dose beta-blocker.  Continue home medications -> titrate CHF medications as able. David Harding, MD   Cardiac Studies   Cath: 05/16/20  Ost LM to Mid LM lesion is 30% stenosed.  Mid LM to Dist LM lesion is 55% stenosed.  Prox Cx lesion is 30% stenosed.  Ost RCA lesion is 55% stenosed.  -------------------------------------------------  LV end diastolic pressure is moderately  elevated.  Hemodynamic findings consistent with moderate pulmonary hypertension.   SUMMARY  Heavily calcified left main diffuse disease most prominently 50% distal just prior to trifurcation into LAD, RI and LCx. ? Mild diffuse disease in the LCA after Left Main    Calcified Ostial RCA 50 to 60% with Otherwise Mild Diffuse Disease.    Moderate Secondary Pulmonary Hypertension with PAP 49/24 mmHg-mean 36 mmHg; PCWP (and LVEDP) 22 mmHg; RAP 12 mmHg.  Stable Cardiac output 4.9, with Index of 2.3.  Apparent atrial flutter on monitor   RECOMMENDATIONS  Given advanced age with distal left main calcified disease, EDP of 22, relatively rapid apparent atrial flutter, I feel that is safest for the patient to be evaluated with CVTS consult while in the hospital.    We will gently diurese him today, and start IV heparin for anticoagulation given atrial flutter.  Will also add low-dose beta-blocker.  Continue home medications -> titrate CHF medications as able.  David Harding, MD  Diagnostic Dominance: Right      Patient Profile     79 y.o. male with PMH of CAD (previously mild to moderate diffuse CAD), Chronic systolic HF, HTN, HLD and PAD who presented for outpatient cardiac cath. Recommendations for inpatient TCTS consult based on cath results.   Assessment & Plan    1. CAD: Underwent cardiac cath noted above with 30% mid LM, 55% distal LM, 55% ostial RCA, 30% pLCx. Given advanced age with distal LM and elevated LVEDP, recommendation for TCTS. Official recommendations from Dr. Atkins pending.  -- continue ASA, statin, Entresto, coreg  2. Acute on Chronic HF: known EF of 20-25%, elevated LVEDP on cath. No significant volume overload on exam.   -- on coreg and Entresto PTA, blood pressures are soft. Will defer spiro at this time.  -- torsemide 40mg daily   3. Atrial flutter: noted while in the cath lab. CHA2DS2-VASc Score of 4.  -- placed on IV heparin pending TCTS consult --  long discussion with the patient regarding circumstances surrounding atrial fib/flutter. Last echo from 04/2019 noted severely dilated LA. He is determined to go home today. Will further review plan with MD.    4. HTN: stable with coreg and Entresto, unable to further titrate at this time.   5. HLD: on statin  For questions or updates, please contact [facility] Please consult [email or web address] for contact info under        Signed, Lindsay Roberts, NP  05/17/2020, 7:47 AM    

## 2020-05-20 ENCOUNTER — Encounter (INDEPENDENT_AMBULATORY_CARE_PROVIDER_SITE_OTHER): Payer: Medicare Other

## 2020-05-20 ENCOUNTER — Telehealth: Payer: Self-pay | Admitting: Cardiology

## 2020-05-20 DIAGNOSIS — I4892 Unspecified atrial flutter: Secondary | ICD-10-CM | POA: Diagnosis not present

## 2020-05-20 DIAGNOSIS — Z79899 Other long term (current) drug therapy: Secondary | ICD-10-CM | POA: Diagnosis not present

## 2020-05-20 DIAGNOSIS — I455 Other specified heart block: Secondary | ICD-10-CM | POA: Diagnosis not present

## 2020-05-20 DIAGNOSIS — I447 Left bundle-branch block, unspecified: Secondary | ICD-10-CM

## 2020-05-20 NOTE — Telephone Encounter (Signed)
Glenn Sullivan from [facility] is calling to report an auto-triggered event on this pts monitor from today 5/6 at 2:47 pm CST, showing first documented episode of aflutter with HR-70 bpm.  Per Glenn Sullivan with [employer], they will fax this report over to our office here in the next few mins.  Triage to await for faxed report.  Noted that this pt is an established Dr. [name] pt in our [place] office.  He also saw Dr. Curt Bears in [place] back in 2020.  Monitor was ordered under Dr. Curt Bears, and results to be CC'ed to Dr. [name].  [employer] monitor notifications need to be sent to [facility] office to have addressed, being that is where the pt receives his cardiac care from.   Monitor report received.- [employer] critical report day 1-   Baseline auto-triggered event showed pt in atrial flutter w/ Variable conduction, HR-71 bpm.   Called the pt and asked if he was symptomatic at this recorded time.  Pt states he was asymptomatic at this time.  Pt states this was around the same time he placed his monitor on for the first time.   Pt has hx of afib/flutter.  He is on Eliquis and confirms he is compliant with taking this med, as well as all his other cardiac meds.  Went and showed our DOD  monitor recording.  Per Dr. Angelena Form DOD, no changes to be made, continue monitor, and pt to continue his current med regimen.  Pt made aware of this and agrees with this plan.    Per DOD Dr. Angelena Form, monitor recordings should be faxed to the appropriate [facility] office to be addressed, being the pt is not established at [facility].   Will route this message to our [facility] triage, Dr. [name] and his RN, as well as Dr. Curt Bears and his covering RN, to make them aware of this.   Will place signed monitor in med rec box to be scanned into the pts chart.

## 2020-05-20 NOTE — Telephone Encounter (Signed)
Calling with irregular ekg

## 2020-05-23 ENCOUNTER — Other Ambulatory Visit: Payer: Self-pay

## 2020-05-23 DIAGNOSIS — Z79899 Other long term (current) drug therapy: Secondary | ICD-10-CM | POA: Diagnosis not present

## 2020-05-23 DIAGNOSIS — I455 Other specified heart block: Secondary | ICD-10-CM

## 2020-05-23 DIAGNOSIS — I4892 Unspecified atrial flutter: Secondary | ICD-10-CM | POA: Diagnosis not present

## 2020-05-23 DIAGNOSIS — I255 Ischemic cardiomyopathy: Secondary | ICD-10-CM | POA: Diagnosis not present

## 2020-05-23 DIAGNOSIS — I25118 Atherosclerotic heart disease of native coronary artery with other forms of angina pectoris: Secondary | ICD-10-CM | POA: Diagnosis not present

## 2020-05-23 DIAGNOSIS — I5043 Acute on chronic combined systolic (congestive) and diastolic (congestive) heart failure: Secondary | ICD-10-CM | POA: Diagnosis not present

## 2020-05-23 LAB — BASIC METABOLIC PANEL
BUN/Creatinine Ratio: 20 (ref 10–24)
BUN: 32 mg/dL — ABNORMAL HIGH (ref 8–27)
CO2: 25 mmol/L (ref 20–29)
Calcium: 9.4 mg/dL (ref 8.6–10.2)
Chloride: 96 mmol/L (ref 96–106)
Creatinine, Ser: 1.62 mg/dL — ABNORMAL HIGH (ref 0.76–1.27)
Glucose: 72 mg/dL (ref 65–99)
Potassium: 5 mmol/L (ref 3.5–5.2)
Sodium: 136 mmol/L (ref 134–144)
eGFR: 43 mL/min/{1.73_m2} — ABNORMAL LOW (ref >59)

## 2020-05-24 ENCOUNTER — Telehealth: Payer: Self-pay

## 2020-05-24 LAB — DIGOXIN LEVEL: Digoxin, Serum: 0.5 ng/mL (ref 0.5–0.9)

## 2020-05-24 NOTE — Telephone Encounter (Signed)
-----   Message from Richardo Priest, MD sent at 05/24/2020  7:32 AM EDT ----- Stable results, no change in treatment

## 2020-05-24 NOTE — Telephone Encounter (Signed)
Spoke with patient regarding results and recommendation.  Patient verbalizes understanding and is agreeable to plan of care. Advised patient to call back with any issues or concerns.  

## 2020-05-29 NOTE — Progress Notes (Signed)
Electrophysiology Office Note   Date:  05/30/2020   ID:  JOSEPHANTHONY TINDEL, DOB 1940-04-11, MRN [id number]  PCP:  Garwin Brothers, MD  Cardiologist:  [name] Primary Electrophysiologist:  Lillyanna Glandon Meredith Leeds, MD    No chief complaint on file.    History of Present Illness: Glenn Sullivan is a 80 y.o. male who is being seen today for the evaluation of CHF at the request of Tobb, Kardie, DO. Presenting today for electrophysiology evaluation.  He has a history of coronary artery disease, chronic systolic heart failure, hypertension, left bundle branch block, and PAD.  He is currently on optimal therapy with Entresto and beta-blockers.  He was initially thought that he would benefit from CRT, though he declined.  He had a left heart catheterization 05/17/2020, though he declined CABG.  He was found to have a 55% left main stenosis.  Today, denies symptoms of palpitations, chest pain, shortness of breath, orthopnea, PND, lower extremity edema, claudication, dizziness, presyncope, syncope, bleeding, or neurologic sequela. The patient is tolerating medications without difficulties.  His main complaint today is weakness and fatigue.  He has been feeling this way for the last few months.  His wife states that he has not been able to do many of his daily activities without restriction.   Past Medical History:  Diagnosis Date  . AAA (abdominal aortic aneurysm) without rupture ([place]) 12/24/2013  . Barrett's esophagus   . Cardiomyopathy ([place]) 03/07/2018   EF 35% in 2017  . Chronic combined systolic and diastolic CHF (congestive heart failure) ([place]) 04/25/2018  . Coronary artery disease 03/07/2018   Left heart cath 2017:Conclusions Peripheral Procedure Description Patent stent graft in abdominal aorta Patent Renal artery stents Diagnostic Procedure Summary Diffuse Mild to Moderate calcific coronary artery disease. IFR in the RCA was 1.0 after hyperemic response with adenosine. Moderate global LV systolic  dysfunction. LV ejection fraction is 35% Diagnostic Procedure Recommendations Medical the  . Diarrhea 04/28/2018  . Dyslipidemia 10/28/2015  . H/O aortic aneurysm repair 10/28/2015  . Hypertensive heart disease with heart failure ([place]) 03/07/2018  . LBBB (left bundle branch block) 04/25/2018   QRSD 180 msec  . Myocardial infarction [facility])    Past Surgical History:  Procedure Laterality Date  . ABDOMINAL AORTIC ANEURYSM REPAIR    . EVAR    . RENAL ARTERY STENT    . RIGHT/LEFT HEART CATH AND CORONARY ANGIOGRAPHY N/A 05/16/2020   Procedure: RIGHT/LEFT HEART CATH AND CORONARY ANGIOGRAPHY;  Surgeon: Leonie Man, MD;  Location: [facility];  Service: Cardiovascular;  Laterality: N/A;     Current Outpatient Medications  Medication Sig Dispense Refill  . apixaban (ELIQUIS) 5 MG TABS tablet Take 1 tablet (5 mg total) by mouth 2 (two) times daily. 60 tablet 3  . aspirin EC 81 MG tablet Take 81 mg by mouth daily.    Marland Kitchen atorvastatin (LIPITOR) 40 MG tablet Take 1 tablet (40 mg total) by mouth daily. 90 tablet 0  . carvedilol (COREG) 3.125 MG tablet Take 1 tablet (3.125 mg total) by mouth 2 (two) times daily with a meal. 60 tablet 2  . digoxin (LANOXIN) 0.125 MG tablet Take 0.5 tablets (0.0625 mg total) by mouth daily. 30 tablet 1  . fluticasone (FLONASE) 50 MCG/ACT nasal spray Place 1 spray into both nostrils daily as needed for allergies.    Marland Kitchen gabapentin (NEURONTIN) 300 MG capsule Take 300 mg by mouth at bedtime as needed (sleep).    Marland Kitchen omeprazole (PRILOSEC) 20 MG  capsule Take 20 mg by mouth daily.    . sacubitril-valsartan (ENTRESTO) 49-51 MG Take 1 tablet by mouth 2 (two) times daily.    Marland Kitchen torsemide 40 MG TABS Take 40 mg by mouth daily. 30 tablet 2   No current facility-administered medications for this visit.    Allergies:   Tetanus toxoids and Lisinopril   Social History:  The patient  reports that he quit smoking about 13 years ago. His smoking use included cigarettes. He has never  used smokeless tobacco. He reports current alcohol use. He reports that he does not use drugs.   Family History:  The patient's family history includes Diabetes in his mother; Heart disease in his mother; Hypertension in his mother; Varicose Veins in his mother.   ROS:  Please see the history of present illness.   Otherwise, review of systems is positive for none.   All other systems are reviewed and negative.   PHYSICAL EXAM: VS:  BP 126/60   Pulse 78   Ht 6' (1.829 m)   Wt 203 lb 12.8 oz (92.4 kg)   SpO2 98%   BMI 27.64 kg/m  , BMI Body mass index is 27.64 kg/m. GEN: Well nourished, well developed, in no acute distress  HEENT: normal  Neck: no JVD, carotid bruits, or masses Cardiac: RRR; no murmurs, rubs, or gallops,no edema  Respiratory:  clear to auscultation bilaterally, normal work of breathing GI: soft, nontender, nondistended, + BS MS: no deformity or atrophy  Skin: warm and dry Neuro:  Strength and sensation are intact Psych: euthymic mood, full affect  EKG:  EKG is ordered today. Personal review of the ekg ordered shows atrial flutter, left bundle branch block  Recent Labs: 05/11/2020: Magnesium 2.1; NT-Pro BNP 2,980 05/17/2020: Hemoglobin 13.5; Platelets 181 05/23/2020: BUN 32; Creatinine, Ser 1.62; Potassium 5.0; Sodium 136    Lipid Panel     Component Value Date/Time   CHOL 165 11/24/2018 0914   TRIG 67 11/24/2018 0914   HDL 77 11/24/2018 0914   CHOLHDL 2.1 11/24/2018 0914   LDLCALC 75 11/24/2018 0914     Wt Readings from Last 3 Encounters:  05/30/20 203 lb 12.8 oz (92.4 kg)  05/17/20 202 lb 14.4 oz (92 kg)  05/11/20 205 lb 12.8 oz (93.4 kg)      Other studies Reviewed: Additional studies/ records that were reviewed today include: TTE 03/13/2018 Review of the above records today demonstrates:   1. The left ventricle has severely reduced systolic function, with an ejection fraction of 20-25%. The cavity size was normal. Left ventricular diastolic Doppler  parameters are consistent with pseudonormalization.  2. The right ventricle has normal systolic function. The cavity was normal. There is no increase in right ventricular wall thickness.  3. Left atrial size was mildly dilated.  4. The mitral valve is normal in structure. Mitral valve regurgitation is mild to moderate by color flow Doppler.  5. The tricuspid valve is normal in structure.  6. The aortic valve is tricuspid Mild calcification of the aortic valve.  7. The pulmonic valve was normal in structure.  8. Severe akinesis of the left ventricular septal.  Left heart catheterization 5-22  Ost LM to Mid LM lesion is 30% stenosed.  Mid LM to Dist LM lesion is 55% stenosed.  Prox Cx lesion is 30% stenosed.  Ost RCA lesion is 55% stenosed.  -------------------------------------------------  LV end diastolic pressure is moderately elevated.  Hemodynamic findings consistent with moderate pulmonary hypertension.   ASSESSMENT AND  PLAN:  1.  Chronic systolic heart failure due to ischemic cardiomyopathy: NYHA class II symptoms.  Currently on optimal medical therapy with Entresto and beta-blockers.  At this point, he is feeling fatigued and short of breath.  CRT-P was offered.  At this point he has agreed.  Risks and benefits were discussed include bleeding, tamponade, infection, pneumothorax, among others.  He understands these risks and has agreed to the procedure.  2.  Coronary artery disease: No current chest pain.  Plan per primary cardiology.    3.  Hyperlipidemia: Continue statin per primary cardiology  4.  Atrial flutter: Appears somewhat atypical.  Currently on Eliquis.  CHA2DS2-VASc of 4.  We Nakeeta Sebastiani start him on amiodarone today.  We [place] plan for cardioversion.  Case discussed with primary cardiology  Current medicines are reviewed at length with the patient today.   The patient does not have concerns regarding his medicines.  The following changes were made today: Start  amiodarone  Labs/ tests ordered today include:  Orders Placed This Encounter  Procedures  . EKG 12-Lead     Disposition:   FU with Analicia Skibinski 6 weeks   Signed, Dalonte Hardage Meredith Leeds, MD  05/30/2020 11:43 AM     [facility] [address] [place] [address] [phone] (office) [phone] (fax)

## 2020-05-30 ENCOUNTER — Ambulatory Visit (INDEPENDENT_AMBULATORY_CARE_PROVIDER_SITE_OTHER): Payer: Medicare Other | Admitting: Cardiology

## 2020-05-30 ENCOUNTER — Telehealth: Payer: Self-pay | Admitting: Cardiology

## 2020-05-30 ENCOUNTER — Encounter: Payer: Self-pay | Admitting: Cardiology

## 2020-05-30 ENCOUNTER — Other Ambulatory Visit: Payer: Self-pay | Admitting: Cardiology

## 2020-05-30 ENCOUNTER — Other Ambulatory Visit: Payer: Self-pay

## 2020-05-30 VITALS — BP 126/60 | HR 78 | Ht 72.0 in | Wt 203.8 lb

## 2020-05-30 DIAGNOSIS — I5022 Chronic systolic (congestive) heart failure: Secondary | ICD-10-CM | POA: Diagnosis not present

## 2020-05-30 DIAGNOSIS — I2510 Atherosclerotic heart disease of native coronary artery without angina pectoris: Secondary | ICD-10-CM

## 2020-05-30 MED ORDER — AMIODARONE HCL 200 MG PO TABS: ORAL_TABLET | ORAL | 0 refills | Status: DC

## 2020-05-30 MED ORDER — AMIODARONE HCL 200 MG PO TABS: 200.0000 mg | ORAL_TABLET | Freq: Every day | ORAL | 1 refills | Status: DC

## 2020-05-30 NOTE — Telephone Encounter (Signed)
    Pt c/o medication issue:  1. Name of Medication:   amiodarone (PACERONE) 200 MG tablet    2. How are you currently taking this medication (dosage and times per day)? As written   3. Are you having a reaction (difficulty breathing--STAT)?  4. What is your medication issue? Pt and his wife would like to speak with Sherri, regarding this new medication Dr. Curt Bears prescribed today. They said they went to pharmacy to pick it up but the pharmacy said they couldn't fill it because there's a drug interaction with one of pt medication digoxin (LANOXIN) 0.125 MG tablet . They wanted to clarify if pt needs to take both of medication.

## 2020-05-30 NOTE — Telephone Encounter (Signed)
Refill sent to pharmacy.   

## 2020-05-30 NOTE — Patient Instructions (Addendum)
Medication Instructions:  Your physician has recommended you make the following change in your medication:  START Amiodarone  - take 2 tablets (400 mg total) TWICE a day for 2 weeks, then  - take 1 tablet (200 mg total) TWICE a day for 2 weeks, then  - take 1 tablet (200 mg total) ONCE a day  *If you need a refill on your cardiac medications before your next appointment, please call your pharmacy*   Lab Work: None ordered   Testing/Procedures: Your physician has recommended that you have a Cardioversion (DCCV). Electrical Cardioversion uses a jolt of electricity to your heart either through paddles or wired patches attached to your chest. This is a controlled, usually prescheduled, procedure. Defibrillation is done under light anesthesia in the hospital, and you usually go home the day of the procedure. This is done to get your heart back into a normal rhythm. You are not awake for the procedure. Please see the instruction sheet below under "other instructions".  CRT or cardiac resynchronization therapy is a treatment used to correct heart failure. When you have heart failure your heart is weakened and doesn't pump as well as it should. This therapy may help reduce symptoms and improve the quality of life.  Please see the handout/brochure given to you today to get more information of the different options of therapy.  Please see the instruction sheet below under "other instructions".   Follow-Up: At [facility], you and your health needs are our priority.  As part of our continuing mission to provide you with exceptional heart care, we have created designated Provider Care Teams.  These Care Teams include your primary Cardiologist (physician) and Advanced Practice Providers (APPs -  Physician Assistants and Nurse Practitioners) who all work together to provide you with the care you need, when you need it.  Your next appointment:    07/11/2020 @ 12:00 pm  The format for your next  appointment:   In Person  Provider:   Allegra Lai, MD    Thank you for choosing [facility]!!   Trinidad Curet, RN [phone]   Other Instructions  You are scheduled for a Cardioversion on 06/15/2020 with Dr. Johney Frame.  Please arrive at the [facility] (Main Entrance A) at [facility]: [address] [place], [place] [address] at 9:00  am.  DIET: Nothing to eat or drink after midnight except a sip of water with medications (see medication instructions below)  Medication Instructions: Hold Torsemide & Entresto  Continue your anticoagulant: Eliquis You will need to continue your anticoagulant after your procedure until you  are told by your provider that it is safe to stop   Labs: 05/23/2020  Bring your insurance cards.  *Special Note: Every effort is made to have your procedure done on time. Occasionally there are emergencies that occur at the hospital that may cause delays. Please be patient if a delay does occur.     Amiodarone tablets What is this medicine? AMIODARONE (a MEE oh da rone) is an antiarrhythmic drug. It helps make your heart beat regularly. Because of the side effects caused by this medicine, it is only used when other medicines have not worked. It is usually used for heartbeat problems that may be life threatening. This medicine may be used for other purposes; ask your health care provider or pharmacist if you have questions. COMMON BRAND NAME(S): Cordarone, Pacerone What should I tell my health care provider before I take this medicine? They need to know  if you have any of these conditions:  liver disease  lung disease  other heart problems  thyroid disease  an unusual or allergic reaction to amiodarone, iodine, other medicines, foods, dyes, or preservatives  pregnant or trying to get pregnant  breast-feeding How should I use this medicine? Take this medicine by mouth with a glass of water. Follow the directions on the  prescription label. You can take this medicine with or without food. However, you should always take it the same way each time. Take your doses at regular intervals. Do not take your medicine more often than directed. Do not stop taking except on the advice of your doctor or health care professional. A special MedGuide will be given to you by the pharmacist with each prescription and refill. Be sure to read this information carefully each time. Talk to your pediatrician regarding the use of this medicine in children. Special care may be needed. Overdosage: If you think you have taken too much of this medicine contact a poison control center or emergency room at once. NOTE: This medicine is only for you. Do not share this medicine with others. What if I miss a dose? If you miss a dose, take it as soon as you can. If it is almost time for your next dose, take only that dose. Do not take double or extra doses. What may interact with this medicine? Do not take this medicine with any of the following medications:  abarelix  apomorphine  arsenic trioxide  certain antibiotics like erythromycin, gemifloxacin, levofloxacin, pentamidine  certain medicines for depression like amoxapine, tricyclic antidepressants  certain medicines for fungal infections like fluconazole, itraconazole, ketoconazole, posaconazole, voriconazole  certain medicines for irregular heart beat like disopyramide, dronedarone, ibutilide, propafenone, sotalol  certain medicines for malaria like chloroquine, halofantrine  cisapride  droperidol  haloperidol  haw[place]orn  maprotiline  methadone  phenothiazines like chlorpromazine, mesoridazine, thioridazine  pimozide  ranolazine  red yeast rice  vardenafil This medicine may also interact with the following medications:  antiviral medicines for HIV or AIDS  certain medicines for blood pressure, heart disease, irregular heart beat  certain medicines for  cholesterol like atorvastatin, cerivastatin, lovastatin, simvastatin  certain medicines for hepatitis C like sofosbuvir and ledipasvir; sofosbuvir  certain medicines for seizures like phenytoin  certain medicines for thyroid problems  certain medicines that treat or prevent blood clots like warfarin  cholestyramine  cimetidine  clopidogrel  cyclosporine  dextromethorphan  diuretics  dofetilide  fentanyl  general anesthetics  grapefruit juice  lidocaine  loratadine  methotrexate  other medicines that prolong the QT interval (cause an abnormal heart rhythm)  procainamide  quinidine  rifabutin, rifampin, or rifapentine  St. [name]'s Wort  trazodone  ziprasidone This list may not describe all possible interactions. Give your health care provider a list of all the medicines, herbs, non-prescription drugs, or dietary supplements you use. Also tell them if you smoke, drink alcohol, or use illegal drugs. Some items may interact with your medicine. What should I watch for while using this medicine? Your condition will be monitored closely when you first begin therapy. Often, this drug is first started in a hospital or other monitored health care setting. Once you are on maintenance therapy, visit your doctor or health care professional for regular checks on your progress. Because your condition and use of this medicine carry some risk, it is a good idea to carry an identification card, necklace or bracelet with details of your condition, medications, and doctor or  health care professional. Dennis Bast may get drowsy or dizzy. Do not drive, use machinery, or do anything that needs mental alertness until you know how this medicine affects you. Do not stand or sit up quickly, especially if you are an older patient. This reduces the risk of dizzy or fainting spells. This medicine can make you more sensitive to the sun. Keep out of the sun. If you cannot avoid being in the sun, wear  protective clothing and use sunscreen. Do not use sun lamps or tanning beds/booths. You should have regular eye exams before and during treatment. Call your doctor if you have blurred vision, see halos, or your eyes become sensitive to light. Your eyes may get dry. It may be helpful to use a lubricating eye solution or artificial tears solution. If you are going to have surgery or a procedure that requires contrast dyes, tell your doctor or health care professional that you are taking this medicine. What side effects may I notice from receiving this medicine? Side effects that you should report to your doctor or health care professional as soon as possible:  allergic reactions like skin rash, itching or hives, swelling of the face, lips, or tongue  blue-gray coloring of the skin  blurred vision, seeing blue green halos, increased sensitivity of the eyes to light  breathing problems  chest pain  dark urine  fast, irregular heartbeat  feeling faint or light-headed  intolerance to heat or cold  nausea or vomiting  pain and swelling of the scrotum  pain, tingling, numbness in feet, hands  redness, blistering, peeling or loosening of the skin, including inside the mouth  spitting up blood  stomach pain  sweating  unusual or uncontrolled movements of body  unusually weak or tired  weight gain or loss  yellowing of the eyes or skin Side effects that usually do not require medical attention (report to your doctor or health care professional if they continue or are bothersome):  change in sex drive or performance  constipation  dizziness  headache  loss of appetite  trouble sleeping This list may not describe all possible side effects. Call your doctor for medical advice about side effects. You may report side effects to FDA at 1-800-FDA-1088. Where should I keep my medicine? Keep out of the reach of children. Store at room temperature between 20 and 25 degrees C  (68 and 77 degrees F). Protect from light. Keep container tightly closed. Throw away any unused medicine after the expiration date. NOTE: This sheet is a summary. It may not cover all possible information. If you have questions about this medicine, talk to your doctor, pharmacist, or health care provider.  2021 Elsevier/Gold Standard (2017-12-04 13:44:04)

## 2020-05-30 NOTE — Telephone Encounter (Signed)
Returned call to wife. Aware I will discuss Digoxin stopping/weaning with general cardiology tomorrow and let them know recommendation. Wife is agreeable to plan and appreciates call.

## 2020-05-31 NOTE — Telephone Encounter (Signed)
Advised to stop Digoxin and made aware pt did NOT need to wean off being that he is on such a low dose. Pt will start Amiodarone today. They appreciate the call.

## 2020-06-14 ENCOUNTER — Encounter (HOSPITAL_COMMUNITY): Payer: Self-pay | Admitting: Anesthesiology

## 2020-06-14 NOTE — Anesthesia Preprocedure Evaluation (Deleted)
Anesthesia Evaluation    Reviewed: Allergy & Precautions, Patient's Chart, lab work & pertinent test results, reviewed documented beta blocker date and time   History of Anesthesia Complications Negative for: history of anesthetic complications  Airway        Dental   Pulmonary neg pulmonary ROS, former smoker,           Cardiovascular hypertension, Pt. on medications and Pt. on home beta blockers + CAD, + Past MI, + Peripheral Vascular Disease and +CHF  + dysrhythmias (on amiodarone and Eliquis) Atrial Fibrillation   TTE  04/23/19: EF 20-25%, severe LVE, grade I DD, severe LAE, mild MR, mild to moderate dilatation of ascending aorta measuring 17mm    Neuro/Psych negative neurological ROS  negative psych ROS   GI/Hepatic Neg liver ROS, GERD  Medicated and Controlled,  Endo/Other  negative endocrine ROS  Renal/GU negative Renal ROS  negative genitourinary   Musculoskeletal negative musculoskeletal ROS (+)   Abdominal   Peds  Hematology negative hematology ROS (+)   Anesthesia Other Findings Day of surgery medications reviewed with patient.  Reproductive/Obstetrics negative OB ROS                             Anesthesia Physical Anesthesia Plan  ASA: IV  Anesthesia Plan: General   Post-op Pain Management:    Induction: Intravenous  PONV Risk Score and Plan: Treatment may vary due to age or medical condition and TIVA  Airway Management Planned: Mask  Additional Equipment: None  Intra-op Plan:   Post-operative Plan:   Informed Consent:   Plan Discussed with:   Anesthesia Plan Comments:         Anesthesia Quick Evaluation

## 2020-06-15 ENCOUNTER — Ambulatory Visit (HOSPITAL_COMMUNITY)
Admission: RE | Admit: 2020-06-15 | Discharge: 2020-06-15 | Disposition: A | Discharge status: Home / Self Care | Payer: Medicare Other | Attending: Cardiology | Admitting: Cardiology

## 2020-06-15 ENCOUNTER — Encounter (HOSPITAL_COMMUNITY): Payer: Self-pay | Admitting: Cardiology

## 2020-06-15 ENCOUNTER — Other Ambulatory Visit: Payer: Self-pay

## 2020-06-15 ENCOUNTER — Encounter (HOSPITAL_COMMUNITY)
Admission: RE | Disposition: A | Discharge status: Home / Self Care | Payer: Self-pay | Source: Home / Self Care | Attending: Cardiology

## 2020-06-15 DIAGNOSIS — I4891 Unspecified atrial fibrillation: Secondary | ICD-10-CM | POA: Insufficient documentation

## 2020-06-15 DIAGNOSIS — I4819 Other persistent atrial fibrillation: Secondary | ICD-10-CM

## 2020-06-15 DIAGNOSIS — I447 Left bundle-branch block, unspecified: Secondary | ICD-10-CM | POA: Insufficient documentation

## 2020-06-15 DIAGNOSIS — Z538 Procedure and treatment not carried out for other reasons: Secondary | ICD-10-CM | POA: Insufficient documentation

## 2020-06-15 SURGERY — CANCELLED PROCEDURE

## 2020-06-15 NOTE — Progress Notes (Signed)
Patient presented for planned DCCV for Afib but was found to be in NSR with LBBB and first degree AVB. Procedure cancelled. Will follow-up with out-patient cardiologist as scheduled.  Gwyndolyn Kaufman, MD

## 2020-06-15 NOTE — Progress Notes (Signed)
Pt admitted to endo for direct current cardioversion. Upon ECG examination, pt was in 1st degree heart block with left bundle branch block. Dr. Johney Frame, MD made aware and elected to cancel the case. Pt discharged to home.

## 2020-06-19 ENCOUNTER — Other Ambulatory Visit: Payer: Self-pay | Admitting: Cardiology

## 2020-06-22 ENCOUNTER — Other Ambulatory Visit: Payer: Self-pay | Admitting: Cardiology

## 2020-06-22 DIAGNOSIS — I4892 Unspecified atrial flutter: Secondary | ICD-10-CM

## 2020-06-22 DIAGNOSIS — I447 Left bundle-branch block, unspecified: Secondary | ICD-10-CM

## 2020-06-22 DIAGNOSIS — I455 Other specified heart block: Secondary | ICD-10-CM

## 2020-06-23 ENCOUNTER — Telehealth: Payer: Self-pay | Admitting: Cardiology

## 2020-06-23 NOTE — Telephone Encounter (Signed)
Patient's wife would like to know if the patient needs to continue taking amiodarone (PACERONE) 200 MG tablet. If so, the patient will need a refill.   *STAT* If patient is at the pharmacy, call can be transferred to refill team.   1. Which medications need to be refilled? (please list name of each medication and dose if known)  amiodarone (PACERONE) 200 MG tablet  2. Which pharmacy/location (including street and city if local pharmacy) is medication to be sent to? [id number] - [place], [place] - [address]   3. Do they need a 30 day or 90 day supply? 30 day supply

## 2020-06-23 NOTE — Telephone Encounter (Signed)
Called pt and spoke with pt's wife informing her that the pt's medication is already at the pharmacy and that I called the pharmacy and they are getting the medication ready for them to pick up. I advised the wife that if they have any other problems, questions or concerns, to give our office a call. Pt verbalized understanding.

## 2020-06-30 NOTE — Progress Notes (Signed)
Cardiology Office Note:    Date:  07/01/2020   ID:  [name], DOB 1940/01/17, MRN [id number]  PCP:  Garwin Brothers, MD  Cardiologist:  Shirlee More, MD    Referring MD: Garwin Brothers, MD    ASSESSMENT:    1. Paroxysmal atrial fibrillation (HCC)   2. On amiodarone therapy   3. Chronic anticoagulation   4. Chronic systolic (congestive) heart failure (HCC)   5. Hypertensive heart disease with heart failure ([place])   6. LBBB (left bundle branch block)    PLAN:    In order of problems listed above:  Improved continues to maintain sinus rhythm on amiodarone and continues anticoagulant Continue amiodarone Continue Eliquis Compensated no fluid overload continue his loop diuretic beta-blocker increased dose of Entresto and add SGLT2 inhibitor Await CRT therapy He is pending CRT with his cough and bronchospasm sputum production we will give him a short course of antibiotic Augmentin and prednisone to optimize his overall health   Next appointment: 3 months   Medication Adjustments/Labs and Tests Ordered: Current medicines are reviewed at length with the patient today.  Concerns regarding medicines are outlined above.  No orders of the defined types were placed in this encounter.  No orders of the defined types were placed in this encounter.   Chief Complaint  Patient presents with   Follow-up   Coronary Artery Disease   Congestive Heart Failure   Atrial Fibrillation     History of Present Illness:    Glenn Sullivan is a 80 y.o. male with a hx of CAD heart failure with reduced ejection fraction hypertensive heart disease left bundle branch block hyperlipidemia and peripheral arterial disease.  He was last seen in [place] office 05/11/2020.  Subsequently underwent coronary angiography showing heavily calcified left main diffuse disease with 50% distal stenosis just prior to the trifurcation and mild diffuse disease in the left coronary artery and left main and calcified ostial  LAD 50 to 60% stenosis.  He was seen by cardiothoracic surgery but adamantly declined surgical intervention for his multivessel CAD.  He was in atrial fibrillation with scheduled for outpatient cardioversion and spontaneously converted to sinus rhythm.  He subsequent has been seen by EP and is scheduled for biventricular pacemaker in July.  His most recent echocardiogram 04/23/2019 showed EF of 20 to 25% with severe dilation severe left atrial enlargement mild mitral regurgitation and dilation of ascending aorta 41 mm.  Compliance with diet, lifestyle and medications: Yes  He is improved back in sinus rhythm tolerates amiodarone his anticoagulant without bleeding He has had no anginal discomfort on current medical therapy and declined elective CABG He is seeing EP and anticipates CRT therapy Is a very poor exercise tolerance walks a few minutes a day on a treadmill I offered to bring him to cardiac rehab and he declined. I will intensify therapy for heart failure increasing the dose of Entresto and adding SGLT2 inhibitor. He has chronic purulent sputum and has bronchospasm and has been using his wife's bronchodilator Past Medical History:  Diagnosis Date   AAA (abdominal aortic aneurysm) without rupture ([place]) 12/24/2013   Barrett's esophagus    Cardiomyopathy ([place]) 03/07/2018   EF 35% in 2017   Chronic combined systolic and diastolic CHF (congestive heart failure) ([place]) 04/25/2018   Coronary artery disease 03/07/2018   Left heart cath 2017:Conclusions Peripheral Procedure Description Patent stent graft in abdominal aorta Patent Renal artery stents Diagnostic Procedure Summary Diffuse Mild to Moderate calcific coronary artery disease. IFR  in the RCA was 1.0 after hyperemic response with adenosine. Moderate global LV systolic dysfunction. LV ejection fraction is 35% Diagnostic Procedure Recommendations Medical the   Diarrhea 04/28/2018   Dyslipidemia 10/28/2015   H/O aortic aneurysm repair 10/28/2015    Hypertensive heart disease with heart failure ([place]) 03/07/2018   LBBB (left bundle branch block) 04/25/2018   QRSD 180 msec   Myocardial infarction [facility])     Past Surgical History:  Procedure Laterality Date   ABDOMINAL AORTIC ANEURYSM REPAIR     EVAR     RENAL ARTERY STENT     RIGHT/LEFT HEART CATH AND CORONARY ANGIOGRAPHY N/A 05/16/2020   Procedure: RIGHT/LEFT HEART CATH AND CORONARY ANGIOGRAPHY;  Surgeon: Leonie Man, MD;  Location: [facility];  Service: Cardiovascular;  Laterality: N/A;    Current Medications: Current Meds  Medication Sig   amiodarone (PACERONE) 200 MG tablet Take 1 tablet (200 mg total) by mouth daily.   apixaban (ELIQUIS) 5 MG TABS tablet Take 1 tablet (5 mg total) by mouth 2 (two) times daily.   aspirin EC 81 MG tablet Take 81 mg by mouth daily.   atorvastatin (LIPITOR) 40 MG tablet Take 1 tablet (40 mg total) by mouth daily.   carvedilol (COREG) 3.125 MG tablet TAKE 1 TABLET (3.125 MG TOTAL) BY MOUTH 2 (TWO) TIMES DAILY WITH A MEAL.   fluticasone (FLONASE) 50 MCG/ACT nasal spray Place 1 spray into both nostrils daily as needed for allergies.   gabapentin (NEURONTIN) 300 MG capsule Take 300 mg by mouth at bedtime as needed (sleep).   omeprazole (PRILOSEC) 20 MG capsule Take 20 mg by mouth daily.   torsemide 40 MG TABS Take 40 mg by mouth daily.   [DISCONTINUED] sacubitril-valsartan (ENTRESTO) 49-51 MG Take 1 tablet by mouth 2 (two) times daily.     Allergies:   Tetanus toxoids and Lisinopril   Social History   Socioeconomic History   Marital status: Married    Spouse name: Not on file   Number of children: Not on file   Years of education: Not on file   Highest education level: Not on file  Occupational History   Not on file  Tobacco Use   Smoking status: Former    Pack years: 0.00    Types: Cigarettes    Quit date: 01/16/2007    Years since quitting: 13.4   Smokeless tobacco: Never  Vaping Use   Vaping Use: Never used  Substance  and Sexual Activity   Alcohol use: Yes    Comment: occ   Drug use: No   Sexual activity: Not on file  Other Topics Concern   Not on file  Social History Narrative   Not on file   Social Determinants of Health   Financial Resource Strain: Not on file  Food Insecurity: Not on file  Transportation Needs: Not on file  Physical Activity: Not on file  Stress: Not on file  Social Connections: Not on file     Family History: The patient's family history includes Diabetes in his mother; Heart disease in his mother; Hypertension in his mother; Varicose Veins in his mother. ROS:   Please see the history of present illness.    All other systems reviewed and are negative.  EKGs/Labs/Other Studies Reviewed:    The following studies were reviewed today:  EKG:  EKG ordered today and personally reviewed.  The ekg ordered today demonstrates sinus rhythm first-degree AV block left bundle branch block  Recent Labs: 05/11/2020:  Magnesium 2.1; NT-Pro BNP 2,980 05/17/2020: Hemoglobin 13.5; Platelets 181 05/23/2020: BUN 32; Creatinine, Ser 1.62; Potassium 5.0; Sodium 136  Recent Lipid Panel    Component Value Date/Time   CHOL 165 11/24/2018 0914   TRIG 67 11/24/2018 0914   HDL 77 11/24/2018 0914   CHOLHDL 2.1 11/24/2018 0914   LDLCALC 75 11/24/2018 0914    Physical Exam:    VS:  BP 138/62 (BP Location: Right Arm, Patient Position: Sitting, Cuff Size: Normal)   Pulse (!) 59   Ht 6' (1.829 m)   Wt 202 lb 9.6 oz (91.9 kg)   SpO2 96%   BMI 27.48 kg/m     Wt Readings from Last 3 Encounters:  07/01/20 202 lb 9.6 oz (91.9 kg)  06/15/20 203 lb 12.8 oz (92.4 kg)  05/30/20 203 lb 12.8 oz (92.4 kg)     GEN:  Well nourished, well developed in no acute distress HEENT: Normal NECK: No JVD; No carotid bruits LYMPHATICS: No lymphadenopathy CARDIAC: S2 is paradoxical RRR, no murmurs, rubs, gallops RESPIRATORY: Diminished breath sounds and expiratory wheezing ABDOMEN: Soft, non-tender,  non-distended MUSCULOSKELETAL:  No edema; No deformity  SKIN: Warm and dry NEUROLOGIC:  Alert and oriented x 3 PSYCHIATRIC:  Normal affect    Signed, Shirlee More, MD  07/01/2020 4:29 PM     [facility]

## 2020-07-01 ENCOUNTER — Other Ambulatory Visit: Payer: Self-pay

## 2020-07-01 ENCOUNTER — Ambulatory Visit (INDEPENDENT_AMBULATORY_CARE_PROVIDER_SITE_OTHER): Payer: Medicare Other | Admitting: Cardiology

## 2020-07-01 ENCOUNTER — Encounter: Payer: Self-pay | Admitting: Cardiology

## 2020-07-01 VITALS — BP 138/62 | HR 59 | Ht 72.0 in | Wt 202.6 lb

## 2020-07-01 DIAGNOSIS — Z79899 Other long term (current) drug therapy: Secondary | ICD-10-CM | POA: Diagnosis not present

## 2020-07-01 DIAGNOSIS — I5022 Chronic systolic (congestive) heart failure: Secondary | ICD-10-CM

## 2020-07-01 DIAGNOSIS — I480 Paroxysmal atrial fibrillation: Secondary | ICD-10-CM

## 2020-07-01 DIAGNOSIS — I447 Left bundle-branch block, unspecified: Secondary | ICD-10-CM | POA: Diagnosis not present

## 2020-07-01 DIAGNOSIS — I110 Hypertensive heart disease with heart failure: Secondary | ICD-10-CM

## 2020-07-01 DIAGNOSIS — Z7901 Long term (current) use of anticoagulants: Secondary | ICD-10-CM | POA: Diagnosis not present

## 2020-07-01 MED ORDER — DAPAGLIFLOZIN PROPANEDIOL 10 MG PO TABS: 10.0000 mg | ORAL_TABLET | Freq: Every day | ORAL | 3 refills | Status: DC

## 2020-07-01 MED ORDER — PREDNISONE 10 MG PO TABS
10.0000 mg | ORAL_TABLET | Freq: Every day | ORAL | 0 refills | Status: DC
Start: 2020-07-01 — End: 2020-07-19

## 2020-07-01 MED ORDER — SACUBITRIL-VALSARTAN 97-103 MG PO TABS: 1.0000 | ORAL_TABLET | Freq: Two times a day (BID) | ORAL | 3 refills | Status: DC

## 2020-07-01 MED ORDER — AMOXICILLIN-POT CLAVULANATE 875-125 MG PO TABS: 1.0000 | ORAL_TABLET | Freq: Two times a day (BID) | ORAL | 0 refills | Status: DC

## 2020-07-01 NOTE — Patient Instructions (Addendum)
Medication Instructions:  Your physician has recommended you make the following change in your medication:   Increase your Entresto to 97/101 two times daily on your next prescription.  Start Farxgia 10 mg daily Start Augmentin 875 mg twice daily Start Prednisone as directed.  *If you need a refill on your cardiac medications before your next appointment, please call your pharmacy*   Lab Work: None ordered If you have labs (blood work) drawn today and your tests are completely normal, you will receive your results only by: [place] (if you have MyChart) OR A paper copy in the mail If you have any lab test that is abnormal or we need to change your treatment, we will call you to review the results.   Testing/Procedures: None ordered   Follow-Up: At [facility], you and your health needs are our priority.  As part of our continuing mission to provide you with exceptional heart care, we have created designated Provider Care Teams.  These Care Teams include your primary Cardiologist (physician) and Advanced Practice Providers (APPs -  Physician Assistants and Nurse Practitioners) who all work together to provide you with the care you need, when you need it.  We recommend signing up for the patient portal called "MyChart".  Sign up information is provided on this After Visit Summary.  MyChart is used to connect with patients for Virtual Visits (Telemedicine).  Patients are able to view lab/test results, encounter notes, upcoming appointments, etc.  Non-urgent messages can be sent to your provider as well.   To learn more about what you can do with MyChart, go to [email or web address].    Your next appointment:   3 month(s)  The format for your next appointment:   In Person  Provider:   Shirlee More, MD   Other Instructions Prednisone tablets What is this medication? PREDNISONE (PRED ni sone) is a corticosteroid. It is commonly used to treat inflammation of the skin,  joints, lungs, and other organs. Common conditions treated include asthma, allergies, and arthritis. It is also used for otherconditions, such as blood disorders and diseases of the adrenal glands. This medicine may be used for other purposes; ask your health care provider orpharmacist if you have questions. COMMON BRAND NAME(S): Deltasone, Predone, Sterapred, Sterapred DS What should I tell my care team before I take this medication? They need to know if you have any of these conditions: Cushing's syndrome diabetes glaucoma heart disease high blood pressure infection (especially a virus infection such as chickenpox, cold sores, or herpes) kidney disease liver disease mental illness myasthenia gravis osteoporosis seizures stomach or intestine problems thyroid disease an unusual or allergic reaction to lactose, prednisone, other medicines, foods, dyes, or preservatives pregnant or trying to get pregnant breast-feeding How should I use this medication? Take this medicine by mouth with a glass of water. Follow the directions on the prescription label. Take this medicine with food. If you are taking this medicine once a day, take it in the morning. Do not take more medicine than you are told to take. Do not suddenly stop taking your medicine because you may develop a severe reaction. Your doctor will tell you how much medicine to take. If your doctor wants you to stop the medicine, the dose may be slowly loweredover time to avoid any side effects. Talk to your pediatrician regarding the use of this medicine in children.Special care may be needed. Overdosage: If you think you have taken too much of this medicine contact apoison control center or  emergency room at once. NOTE: This medicine is only for you. Do not share this medicine with others. What if I miss a dose? If you miss a dose, take it as soon as you can. If it is almost time for your next dose, talk to your doctor or health care  professional. You may need to miss a dose or take an extra dose. Do not take double or extra doses withoutadvice. What may interact with this medication? Do not take this medicine with any of the following medications: metyrapone mifepristone This medicine may also interact with the following medications: aminoglutethimide amphotericin B aspirin and aspirin-like medicines barbiturates certain medicines for diabetes, like glipizide or glyburide cholestyramine cholinesterase inhibitors cyclosporine digoxin diuretics ephedrine male hormones, like estrogens and birth control pills isoniazid ketoconazole NSAIDS, medicines for pain and inflammation, like ibuprofen or naproxen phenytoin rifampin toxoids vaccines warfarin This list may not describe all possible interactions. Give your health care provider a list of all the medicines, herbs, non-prescription drugs, or dietary supplements you use. Also tell them if you smoke, drink alcohol, or use illegaldrugs. Some items may interact with your medicine. What should I watch for while using this medication? Visit your doctor or health care professional for regular checks on your progress. If you are taking this medicine over a prolonged period, carry an identification card with your name and address, the type and dose of yourmedicine, and your doctor's name and address. This medicine may increase your risk of getting an infection. Tell your doctor or health care professional if you are around anyone with measles orchickenpox, or if you develop sores or blisters that do not heal properly. If you are going to have surgery, tell your doctor or health care professionalthat you have taken this medicine within the last twelve months. Ask your doctor or health care professional about your diet. You may need tolower the amount of salt you eat. This medicine may increase blood sugar. Ask your healthcare provider if changesin diet or medicines are  needed if you have diabetes. What side effects may I notice from receiving this medication? Side effects that you should report to your doctor or health care professionalas soon as possible: allergic reactions like skin rash, itching or hives, swelling of the face, lips, or tongue changes in emotions or moods changes in vision depressed mood eye pain fever or chills, cough, sore throat, pain or difficulty passing urine signs and symptoms of high blood sugar such as being more thirsty or hungry or having to urinate more than normal. You may also feel very tired or have blurry vision. swelling of ankles, feet Side effects that usually do not require medical attention (report to yourdoctor or health care professional if they continue or are bothersome): confusion, excitement, restlessness headache nausea, vomiting skin problems, acne, thin and shiny skin trouble sleeping weight gain This list may not describe all possible side effects. Call your doctor for medical advice about side effects. You may report side effects to FDA at1-800-FDA-1088. Where should I keep my medication? Keep out of the reach of children. Store at room temperature between 15 and 30 degrees C (59 and 86 degrees F). Protect from light. Keep container tightly closed. Throw away any unusedmedicine after the expiration date. NOTE: This sheet is a summary. It may not cover all possible information. If you have questions about this medicine, talk to your doctor, pharmacist, orhealth care provider.  2022 Elsevier/Gold Standard (2017-10-01 10:54:22) Amoxicillin; Clavulanic Acid Tablets What is this medication?  AMOXICILLIN; CLAVULANIC ACID (a mox i SIL in; KLAV yoo lan ic AS id) treats infections caused by bacteria. It belongs to a group of medications called penicillin antibiotics. It will not treat colds, the flu, or infections causedby viruses. This medicine may be used for other purposes; ask your health care provider  orpharmacist if you have questions. COMMON BRAND NAME(S): Augmentin What should I tell my care team before I take this medication? They need to know if you have any of these conditions: Kidney disease Liver disease Mononucleosis Stomach or intestine problems such as colitis An unusual or allergic reaction to amoxicillin, other penicillin or cephalosporin antibiotics, clavulanic acid, other medications, foods, dyes, or preservatives Pregnant or trying to get pregnant Breast-feeding How should I use this medication? Take this medication by mouth. Take it as directed on the prescription label at the same time every day. Take it with food at the start of a meal or snack. Take all of this medication unless your care team tells you to stop it early.Keep taking it even if you think you are better. Talk to your care team about the use of this medication in children. While itmay be prescribed for selected conditions, precautions do apply. Overdosage: If you think you have taken too much of this medicine contact apoison control center or emergency room at once. NOTE: This medicine is only for you. Do not share this medicine with others. What if I miss a dose? If you miss a dose, take it as soon as you can. If it is almost time for yournext dose, take only that dose. Do not take double or extra doses. What may interact with this medication? Allopurinol Anticoagulants Birth control pills Methotrexate Probenecid This list may not describe all possible interactions. Give your health care provider a list of all the medicines, herbs, non-prescription drugs, or dietary supplements you use. Also tell them if you smoke, drink alcohol, or use illegaldrugs. Some items may interact with your medicine. What should I watch for while using this medication? Tell your care team if your symptoms do not start to get better or if they getworse. This medication may cause serious skin reactions. They can happen weeks to  months after starting the medication. Contact your care team right away if you notice fevers or flu-like symptoms with a rash. The rash may be red or purple and then turn into blisters or peeling of the skin. Or, you might notice a red rash with swelling of the face, lips or lymph nodes in your neck or under yourarms. Do not treat diarrhea with over the counter products. Contact your care team ifyou have diarrhea that lasts more than 2 days or if it is severe and watery. If you have diabetes, you may get a false-positive result for sugar in yoururine. Check with your care team. Birth control may not work properly while you are taking this medication. Talkto your care team about using an extra method of birth control. What side effects may I notice from receiving this medication? Side effects that you should report to your care team as soon as possible: Allergic reactions-skin rash, itching, hives, swelling of the face, lips, tongue, or throat Liver injury-right upper belly pain, loss of appetite, nausea, light-colored stool, dark yellow or brown urine, yellowing skin or eyes, unusual weakness or fatigue Redness, blistering, peeling, or loosening of the skin, including inside the mouth Severe diarrhea, fever Unusual vaginal discharge, itching, or odor Side effects that usually do not require medical  attention (report to your careteam if they continue or are bothersome): Diarrhea Nausea Vomiting This list may not describe all possible side effects. Call your doctor for medical advice about side effects. You may report side effects to FDA at1-800-FDA-1088. Where should I keep my medication? Keep out of the reach of children and pets. Store at room temperature between 20 and 25 degrees C (68 and 77 degrees F).Throw away any unused medication after the expiration date. NOTE: This sheet is a summary. It may not cover all possible information. If you have questions about this medicine, talk to your  doctor, pharmacist, orhealth care provider.  2022 Elsevier/Gold Standard (2019-11-25 09:45:03) Dapagliflozin tablets What is this medication? DAPAGLIFLOZIN (DAP a gli FLOE zin) controls blood sugar in people with diabetes. It is used with lifestyle changes like diet and exercise. It also treats heart failure and kidney disease. It may lower the risk for treatment ofheart failure in the hospital or worsened kidney disease. This medicine may be used for other purposes; ask your health care provider orpharmacist if you have questions. COMMON BRAND NAME(S): Wilder Glade What should I tell my care team before I take this medication? They need to know if you have any of these conditions: dehydration diabetic ketoacidosis diet low in salt eating less due to illness, surgery, dieting, or any other reason having surgery history of pancreatitis or pancreas problems history of yeast infection of the penis or vagina if you often drink alcohol infection in the bladder, kidneys, or urinary tract kidney disease low blood pressure on dialysis problems urinating type 1 diabetes uncircumcised male an unusual or allergic reaction to dapagliflozin, other medicines, foods, dyes, or preservatives pregnant or trying to get pregnant breast-feeding How should I use this medication? Take this medicine by mouth with water. Take it as directed on the prescription label at the same time every day. You can take it with or without food. If it upsets your stomach, take it with food. Keep taking it unless your health careprovider tells you to stop. A special MedGuide will be given to you by the pharmacist with eachprescription and refill. Be sure to read this information carefully each time. Talk to your health care provider about the use of this medicine in children.Special care may be needed. Overdosage: If you think you have taken too much of this medicine contact apoison control center or emergency room at  once. NOTE: This medicine is only for you. Do not share this medicine with others. What if I miss a dose? If you miss a dose, take it as soon as you can. If it is almost time for yournext dose, take only that dose. Do not take double or extra doses. What may interact with this medication? Interactions are not expected. This list may not describe all possible interactions. Give your health care provider a list of all the medicines, herbs, non-prescription drugs, or dietary supplements you use. Also tell them if you smoke, drink alcohol, or use illegaldrugs. Some items may interact with your medicine. What should I watch for while using this medication? Visit your health care provider for regular checks on your progress. Tell your health care provider if your symptoms do not start to get better or if they getworse. This medicine can cause a serious condition in which there is too much acid in the blood. If you develop nausea, vomiting, stomach pain, unusual tiredness, or breathing problems, stop taking this medicine and call your doctor right away.If possible, use a ketone dipstick  to check for ketones in your urine. Check with your health care provider if you have severe diarrhea, nausea, and vomiting, or if you sweat a lot. The loss of too much body fluid may make itdangerous for you to take this medicine. A test called the HbA1C (A1C) will be monitored. This is a simple blood test. It measures your blood sugar control over the last 2 to 3 months. You willreceive this test every 3 to 6 months. Learn how to check your blood sugar. Learn the symptoms of low and high bloodsugar and how to manage them. Always carry a quick-source of sugar with you in case you have symptoms of low blood sugar. Examples include hard sugar candy or glucose tablets. Make sure others know that you can choke if you eat or drink when you develop serious symptoms of low blood sugar, such as seizures or unconsciousness. Get  medicalhelp at once. Tell your health care provider if you have high blood sugar. You might need to change the dose of your medicine. If you are sick or exercising more thanusual, you may need to change the dose of your medicine. Do not skip meals. Ask your health care provider if you should avoid alcohol. Many nonprescription cough and cold products contain sugar or alcohol. Thesecan affect blood sugar. Wear a medical ID bracelet or chain. Carry a card that describes yourcondition. List the medicines and doses you take on the card. What side effects may I notice from receiving this medication? Side effects that you should report to your doctor or health care professionalas soon as possible: allergic reactions (skin rash, itching or hives, swelling of the face, lips, or tongue) breathing problems dizziness feeling faint or lightheaded, falls genital infection (fever; tenderness, redness, or swelling in the genitals or area from the genitals to the back of the rectum) kidney injury (trouble passing urine or change in the amount of urine) low blood sugar (feeling anxious; confusion; dizziness; increased hunger; unusually weak or tired; increased sweating; shakiness; cold, clammy skin; irritable; headache; blurred vision; fast heartbeat; loss of consciousness) muscle weakness nausea, vomiting, unusual stomach upset or pain new pain or tenderness, change in skin color, sores or ulcers, or infection in legs or feet penile discharge, itching, or pain unusual tiredness unusual vaginal discharge, itching, or odor urinary tract infection (fever; chills; a burning feeling when urinating; urgent need to urinate more often; blood in the urine; back pain) Side effects that usually do not require medical attention (report to yourdoctor or health care professional if they continue or are bothersome): mild increase in urination thirsty This list may not describe all possible side effects. Call your doctor for  medical advice about side effects. You may report side effects to FDA at1-800-FDA-1088. Where should I keep my medication? Keep out of the reach of children and pets. Store at room temperature between 20 and 25 degrees C (68 and 77 degrees F).Get rid of any unused medicine after the expiration date. To get rid of medicines that are no longer needed or have expired: Take the medicine to a medicine take-back program. Check with your pharmacy or law enforcement to find a location. If you cannot return the medicine, check the label or package insert to see if the medicine should be thrown out in the garbage or flushed down the toilet. If you are not sure, ask your health care provider. If it is safe to put it in the trash, take the medicine out of the container. Mix the  medicine with cat litter, dirt, coffee grounds, or other unwanted substance. Seal the mixture in a bag or container. Put it in the trash. NOTE: This sheet is a summary. It may not cover all possible information. If you have questions about this medicine, talk to your doctor, pharmacist, orhealth care provider.  2022 Elsevier/Gold Standard (2019-06-10 13:18:47)

## 2020-07-11 ENCOUNTER — Encounter: Payer: Self-pay | Admitting: Cardiology

## 2020-07-11 ENCOUNTER — Ambulatory Visit (INDEPENDENT_AMBULATORY_CARE_PROVIDER_SITE_OTHER): Payer: Medicare Other | Admitting: Cardiology

## 2020-07-11 ENCOUNTER — Other Ambulatory Visit: Payer: Self-pay

## 2020-07-11 VITALS — BP 100/58 | HR 57 | Ht 72.0 in | Wt 203.0 lb

## 2020-07-11 DIAGNOSIS — I2510 Atherosclerotic heart disease of native coronary artery without angina pectoris: Secondary | ICD-10-CM | POA: Diagnosis not present

## 2020-07-11 DIAGNOSIS — I5022 Chronic systolic (congestive) heart failure: Secondary | ICD-10-CM | POA: Diagnosis not present

## 2020-07-11 DIAGNOSIS — Z01812 Encounter for preprocedural laboratory examination: Secondary | ICD-10-CM

## 2020-07-11 DIAGNOSIS — I447 Left bundle-branch block, unspecified: Secondary | ICD-10-CM | POA: Diagnosis not present

## 2020-07-11 DIAGNOSIS — Z01818 Encounter for other preprocedural examination: Secondary | ICD-10-CM

## 2020-07-11 NOTE — Patient Instructions (Signed)
Medication Instructions:  Your physician recommends that you continue on your current medications as directed. Please refer to the Current Medication list given to you today.  *If you need a refill on your cardiac medications before your next appointment, please call your pharmacy*   Lab Work: Pre procedure labs today: BMET & CBC If you have labs (blood work) drawn today and your tests are completely normal, you will receive your results only by: [place] (if you have MyChart) OR A paper copy in the mail If you have any lab test that is abnormal or we need to change your treatment, we will call you to review the results.   Testing/Procedures: You are scheduled for a biventricular pacemaker implant on 7/12.  Please see instructions below under "other instructions".   Follow-Up: At [facility], you and your health needs are our priority.  As part of our continuing mission to provide you with exceptional heart care, we have created designated Provider Care Teams.  These Care Teams include your primary Cardiologist (physician) and Advanced Practice Providers (APPs -  Physician Assistants and Nurse Practitioners) who all work together to provide you with the care you need, when you need it.   Your next appointment:   2 week(s) after your pacemaker implant  The format for your next appointment:   In Person  Provider:   Device clinic for a wound check    Thank you for choosing [facility]!!   Trinidad Curet, RN [phone]   Other Instructions   Implantable Device Instructions  You are scheduled for: BiVentricular pacemaker implant on 07/26/2020 with Dr. Curt Bears.  1.   Pre procedure testing-             A.  LAB WORK--- On 07/11/2020  for your pre procedure blood work.  You do NOT need to be fasting.  On the day of your procedure 07/26/2020 you will go to [facility] [phone]) at 11:00 am.  Dennis Bast will go to the main entrance A [employer]) and enter  where the [employer] are.  You will check in at ADMITTING.  You may have one support person come in to the hospital with you.  They will be asked to wait in the waiting room.   3.   Do not eat or drink after midnight prior to your procedure.   4.   On the morning of your procedure do NOT take any medication.  5.  The night before your procedure and the morning of your procedure scrub your neck/chest with surgical scrub.  See instruction letter below.   5.  Plan for an overnight stay, but you may be discharged home after your procedure. If you use your phone frequently bring your phone charger, in case you have to stay.  If you are discharged after your procedure you will need someone to drive you home and be with your for 24 hours after your procedure.   6.  You will follow up with the [facility] 10-14 days after your procedure. You will follow up with Dr. Curt Bears 91 days after your procedure.  These appointments will be made for you.  7. FYI: For your safety, and to allow [place] to monitor your vital signs accurately during the surgery/procedure we request that if you have artificial nails, gel coating, SNS etc. Please have those removed prior to your surgery/procedure. Not having the nail coverings /polish removed may result in cancellation or delay  of your surgery/procedure.   * If you have ANY questions after you get home, please call the office [phone] and ask for Aralyn Nowak RN or send a MyChart message.     [place] - Preparing For Surgery (surgical scrub)  Before surgery, you can play an important role. Because skin is not sterile, your skin needs to be as free of germs as possible. You can reduce the number of germs on your skin by washing with CHG (chlorahexidine gluconate) Soap before surgery.  CHG is an antiseptic cleaner which kills germs and bonds with the skin to continue killing germs even after washing.   Please do not use if you have an allergy to CHG  or antibacterial soaps.  If your skin becomes reddened/irritated stop using the CHG.   Do not shave (including legs and underarms) for at least 48 hours prior to first CHG shower.  It is OK to shave your face.  Please follow these instructions carefully:  1.  Shower the night before surgery and the morning of surgery with CHG.  2.  If you choose to wash your hair, wash your hair first as usual with your normal shampoo.  3.  After you shampoo, rinse your hair and body thoroughly to remove the shampoo.  4.  Use CHG as you would any other liquid soap.  You can apply CHG directly to the skin and wash gently with a clean washcloth. 5.  Apply the CHG Soap to your body ONLY FROM THE NECK DOWN.  Do not use on open wounds or open sores.  Avoid contact with your eyes, ears, mouth and genitals (private parts).  Wash genitals (private parts) with your normal soap.  6.  Wash thoroughly, paying special attention to the area where your surgery will be performed.  7.  Thoroughly rinse your body with warm water from the neck down.   8.  DO NOT shower/wash with your normal soap after using and rinsing off the CHG soap.  9.  Pat yourself dry with a clean towel.           10.  Wear clean pajamas.           11.  Place clean sheets on your bed the night of your first shower and do not sleep with pets.  Day of Surgery: Do not apply any deodorants/lotions.  Please wear clean clothes to the hospital/surgery center.

## 2020-07-11 NOTE — Progress Notes (Signed)
Electrophysiology Office Note   Date:  07/11/2020   ID:  Glenn Sullivan, DOB 1940/08/12, MRN [id number]  PCP:  Garwin Brothers, MD  Cardiologist:  [name] Primary Electrophysiologist:  Britteney Ayotte Meredith Leeds, MD    No chief complaint on file.    History of Present Illness: Glenn Sullivan is a 80 y.o. male who is being seen today for the evaluation of CHF at the request of Garwin Brothers, MD. Presenting today for electrophysiology evaluation.  He has a history significant for coronary artery disease, chronic systolic heart failure, hypertension, left bundle branch block.  He is currently on Entresto and carvedilol.  He also notes was noted to be in atrial fibrillation feeling quite poorly.  He has been started on amiodarone.  He has plans for CRT-D on 07/26/2020.  Today, denies symptoms of palpitations, chest pain, shortness of breath, orthopnea, PND, lower extremity edema, claudication, dizziness, presyncope, syncope, bleeding, or neurologic sequela. The patient is tolerating medications without difficulties.  Since starting amiodarone getting back into normal rhythm he is felt much improved.  He is fatigue and shortness of breath are much better.  He is able to do much more of his daily activities, though he does sleep quite a bit.   Past Medical History:  Diagnosis Date   AAA (abdominal aortic aneurysm) without rupture ([place]) 12/24/2013   Barrett's esophagus    Cardiomyopathy ([place]) 03/07/2018   EF 35% in 2017   Chronic combined systolic and diastolic CHF (congestive heart failure) ([place]) 04/25/2018   Coronary artery disease 03/07/2018   Left heart cath 2017:Conclusions Peripheral Procedure Description Patent stent graft in abdominal aorta Patent Renal artery stents Diagnostic Procedure Summary Diffuse Mild to Moderate calcific coronary artery disease. IFR in the RCA was 1.0 after hyperemic response with adenosine. Moderate global LV systolic dysfunction. LV ejection fraction is 35% Diagnostic  Procedure Recommendations Medical the   Diarrhea 04/28/2018   Dyslipidemia 10/28/2015   H/O aortic aneurysm repair 10/28/2015   Hypertensive heart disease with heart failure ([place]) 03/07/2018   LBBB (left bundle branch block) 04/25/2018   QRSD 180 msec   Myocardial infarction [facility])    Past Surgical History:  Procedure Laterality Date   ABDOMINAL AORTIC ANEURYSM REPAIR     EVAR     RENAL ARTERY STENT     RIGHT/LEFT HEART CATH AND CORONARY ANGIOGRAPHY N/A 05/16/2020   Procedure: RIGHT/LEFT HEART CATH AND CORONARY ANGIOGRAPHY;  Surgeon: Leonie Man, MD;  Location: [facility];  Service: Cardiovascular;  Laterality: N/A;     Current Outpatient Medications  Medication Sig Dispense Refill   amiodarone (PACERONE) 200 MG tablet Take 1 tablet (200 mg total) by mouth daily. 90 tablet 1   amoxicillin-clavulanate (AUGMENTIN) 875-125 MG tablet Take 1 tablet by mouth 2 (two) times daily. 20 tablet 0   apixaban (ELIQUIS) 5 MG TABS tablet Take 1 tablet (5 mg total) by mouth 2 (two) times daily. 60 tablet 3   aspirin EC 81 MG tablet Take 81 mg by mouth daily.     atorvastatin (LIPITOR) 40 MG tablet Take 1 tablet (40 mg total) by mouth daily. 90 tablet 0   carvedilol (COREG) 3.125 MG tablet TAKE 1 TABLET (3.125 MG TOTAL) BY MOUTH 2 (TWO) TIMES DAILY WITH A MEAL. 180 tablet 3   dapagliflozin propanediol (FARXIGA) 10 MG TABS tablet Take 1 tablet (10 mg total) by mouth daily before breakfast. 90 tablet 3   fluticasone (FLONASE) 50 MCG/ACT nasal spray Place 1 spray into  both nostrils daily as needed for allergies.     gabapentin (NEURONTIN) 300 MG capsule Take 300 mg by mouth at bedtime as needed (sleep).     omeprazole (PRILOSEC) 20 MG capsule Take 20 mg by mouth daily.     predniSONE (DELTASONE) 10 MG tablet Take 1 tablet (10 mg total) by mouth daily with breakfast. Take 40 mg daily for 4 days Take 20 mg daily for 4 days Take 10 mg daily for 4 days 28 tablet 0   sacubitril-valsartan (ENTRESTO)  97-103 MG Take 1 tablet by mouth 2 (two) times daily. 180 tablet 3   torsemide 40 MG TABS Take 40 mg by mouth daily. 30 tablet 2   No current facility-administered medications for this visit.    Allergies:   Tetanus toxoids and Lisinopril   Social History:  The patient  reports that he quit smoking about 13 years ago. His smoking use included cigarettes. He has never used smokeless tobacco. He reports current alcohol use. He reports that he does not use drugs.   Family History:  The patient's family history includes Diabetes in his mother; Heart disease in his mother; Hypertension in his mother; Varicose Veins in his mother.   ROS:  Please see the history of present illness.   Otherwise, review of systems is positive for none.   All other systems are reviewed and negative.   PHYSICAL EXAM: VS:  BP (!) 100/58   Pulse (!) 57   Ht 6' (1.829 m)   Wt 203 lb (92.1 kg)   SpO2 93%   BMI 27.53 kg/m  , BMI Body mass index is 27.53 kg/m. GEN: Well nourished, well developed, in no acute distress  HEENT: normal  Neck: no JVD, carotid bruits, or masses Cardiac: RRR; no murmurs, rubs, or gallops,no edema  Respiratory:  clear to auscultation bilaterally, normal work of breathing GI: soft, nontender, nondistended, + BS MS: no deformity or atrophy  Skin: warm and dry Neuro:  Strength and sensation are intact Psych: euthymic mood, full affect  EKG:  EKG is not ordered today. Personal review of the ekg ordered 07/01/20 shows sinus rhythm, first-degree AV block, left bundle branch block  Recent Labs: 05/11/2020: Magnesium 2.1; NT-Pro BNP 2,980 05/17/2020: Hemoglobin 13.5; Platelets 181 05/23/2020: BUN 32; Creatinine, Ser 1.62; Potassium 5.0; Sodium 136    Lipid Panel     Component Value Date/Time   CHOL 165 11/24/2018 0914   TRIG 67 11/24/2018 0914   HDL 77 11/24/2018 0914   CHOLHDL 2.1 11/24/2018 0914   LDLCALC 75 11/24/2018 0914     Wt Readings from Last 3 Encounters:  07/11/20 203 lb  (92.1 kg)  07/01/20 202 lb 9.6 oz (91.9 kg)  06/15/20 203 lb 12.8 oz (92.4 kg)      Other studies Reviewed: Additional studies/ records that were reviewed today include: TTE 03/13/2018 Review of the above records today demonstrates:   1. The left ventricle has severely reduced systolic function, with an ejection fraction of 20-25%. The cavity size was normal. Left ventricular diastolic Doppler parameters are consistent with pseudonormalization.  2. The right ventricle has normal systolic function. The cavity was normal. There is no increase in right ventricular wall thickness.  3. Left atrial size was mildly dilated.  4. The mitral valve is normal in structure. Mitral valve regurgitation is mild to moderate by color flow Doppler.  5. The tricuspid valve is normal in structure.  6. The aortic valve is tricuspid Mild calcification of the aortic  valve.  7. The pulmonic valve was normal in structure.  8. Severe akinesis of the left ventricular septal.  Left heart catheterization 5-22 Ost LM to Mid LM lesion is 30% stenosed. Mid LM to Dist LM lesion is 55% stenosed. Prox Cx lesion is 30% stenosed. Ost RCA lesion is 55% stenosed. ------------------------------------------------- LV end diastolic pressure is moderately elevated. Hemodynamic findings consistent with moderate pulmonary hypertension.  Cardiac monitor 06/22/2020 personally reviewed Predominant rhythm was sinus rhythm Up to 25% burden of atrial fibrillation and atrial flutter Rare ventricular ectopy Pauses of up to 3.8 seconds during atrial fibrillation/flutter  ASSESSMENT AND PLAN:  1.  Chronic systolic heart failure due to ischemic cardiomyopathy: NYHA class II symptoms.  Currently on Entresto and beta-blockers.  He has some shortness of breath and fatigue.  We Velena Keegan plan for CRT-P implant on 12/26/2020.    2.  Coronary artery disease: No current chest pain.  Plan per primary cardiology.  3.  Hyperlipidemia: Continue statin  per primary cardiology  4.  Atrial flutter: Appears atypical.  Currently on Eliquis with a CHA2DS2-VASc of 4.  Was started on amiodarone at last visit.  High risk medication monitoring.  Has remained in sinus rhythm.   Current medicines are reviewed at length with the patient today.   The patient does not have concerns regarding his medicines.  The following changes were made today: None  Labs/ tests ordered today include:  Orders Placed This Encounter  Procedures   Basic metabolic panel   CBC      Disposition:   FU with Dajaun Goldring 3 months   Signed, Deamber Buckhalter Meredith Leeds, MD  07/11/2020 1:14 PM     [place] [address] [place] [address] [phone] (office) [phone] (fax)

## 2020-07-11 NOTE — H&P (View-Only) (Signed)
Electrophysiology Office Note   Date:  07/11/2020   ID:  Glenn Sullivan, DOB [birth year], MRN [id number]  PCP:  Garwin Brothers, MD  Cardiologist:  [name] Primary Electrophysiologist:  Ilir Mahrt Meredith Leeds, MD    No chief complaint on file.    History of Present Illness: Glenn Sullivan is a 80 y.o. male who is being seen today for the evaluation of CHF at the request of Garwin Brothers, MD. Presenting today for electrophysiology evaluation.  He has a history significant for coronary artery disease, chronic systolic heart failure, hypertension, left bundle branch block.  He is currently on Entresto and carvedilol.  He also notes was noted to be in atrial fibrillation feeling quite poorly.  He has been started on amiodarone.  He has plans for CRT-D on 07/26/2020.  Today, denies symptoms of palpitations, chest pain, shortness of breath, orthopnea, PND, lower extremity edema, claudication, dizziness, presyncope, syncope, bleeding, or neurologic sequela. The patient is tolerating medications without difficulties.  Since starting amiodarone getting back into normal rhythm he is felt much improved.  He is fatigue and shortness of breath are much better.  He is able to do much more of his daily activities, though he does sleep quite a bit.   Past Medical History:  Diagnosis Date   AAA (abdominal aortic aneurysm) without rupture ([place]) 12/24/2013   Barrett's esophagus    Cardiomyopathy ([place]) 03/07/2018   EF 35% in 2017   Chronic combined systolic and diastolic CHF (congestive heart failure) ([place]) 04/25/2018   Coronary artery disease 03/07/2018   Left heart cath 2017:Conclusions Peripheral Procedure Description Patent stent graft in abdominal aorta Patent Renal artery stents Diagnostic Procedure Summary Diffuse Mild to Moderate calcific coronary artery disease. IFR in the RCA was 1.0 after hyperemic response with adenosine. Moderate global LV systolic dysfunction. LV ejection fraction is 35% Diagnostic  Procedure Recommendations Medical the   Diarrhea 04/28/2018   Dyslipidemia 10/28/2015   H/O aortic aneurysm repair 10/28/2015   Hypertensive heart disease with heart failure ([place]) 03/07/2018   LBBB (left bundle branch block) 04/25/2018   QRSD 180 msec   [facility])    Past Surgical History:  Procedure Laterality Date   ABDOMINAL AORTIC ANEURYSM REPAIR     EVAR     RENAL ARTERY STENT     RIGHT/LEFT HEART CATH AND CORONARY ANGIOGRAPHY N/A 05/16/2020   Procedure: RIGHT/LEFT HEART CATH AND CORONARY ANGIOGRAPHY;  Surgeon: Leonie Man, MD;  Location: [facility];  Service: Cardiovascular;  Laterality: N/A;     Current Outpatient Medications  Medication Sig Dispense Refill   amiodarone (PACERONE) 200 MG tablet Take 1 tablet (200 mg total) by mouth daily. 90 tablet 1   amoxicillin-clavulanate (AUGMENTIN) 875-125 MG tablet Take 1 tablet by mouth 2 (two) times daily. 20 tablet 0   apixaban (ELIQUIS) 5 MG TABS tablet Take 1 tablet (5 mg total) by mouth 2 (two) times daily. 60 tablet 3   aspirin EC 81 MG tablet Take 81 mg by mouth daily.     atorvastatin (LIPITOR) 40 MG tablet Take 1 tablet (40 mg total) by mouth daily. 90 tablet 0   carvedilol (COREG) 3.125 MG tablet TAKE 1 TABLET (3.125 MG TOTAL) BY MOUTH 2 (TWO) TIMES DAILY WITH A MEAL. 180 tablet 3   dapagliflozin propanediol (FARXIGA) 10 MG TABS tablet Take 1 tablet (10 mg total) by mouth daily before breakfast. 90 tablet 3   fluticasone (FLONASE) 50 MCG/ACT nasal spray Place 1 spray into  both nostrils daily as needed for allergies.     gabapentin (NEURONTIN) 300 MG capsule Take 300 mg by mouth at bedtime as needed (sleep).     omeprazole (PRILOSEC) 20 MG capsule Take 20 mg by mouth daily.     predniSONE (DELTASONE) 10 MG tablet Take 1 tablet (10 mg total) by mouth daily with breakfast. Take 40 mg daily for 4 days Take 20 mg daily for 4 days Take 10 mg daily for 4 days 28 tablet 0   sacubitril-valsartan (ENTRESTO)  97-103 MG Take 1 tablet by mouth 2 (two) times daily. 180 tablet 3   torsemide 40 MG TABS Take 40 mg by mouth daily. 30 tablet 2   No current facility-administered medications for this visit.    Allergies:   Tetanus toxoids and Lisinopril   Social History:  The patient  reports that he quit smoking about 13 years ago. His smoking use included cigarettes. He has never used smokeless tobacco. He reports current alcohol use. He reports that he does not use drugs.   Family History:  The patient's family history includes Diabetes in his mother; Heart disease in his mother; Hypertension in his mother; Varicose Veins in his mother.   ROS:  Please see the history of present illness.   Otherwise, review of systems is positive for none.   All other systems are reviewed and negative.   PHYSICAL EXAM: VS:  BP (!) 100/58   Pulse (!) 57   Ht 6' (1.829 m)   Wt 203 lb (92.1 kg)   SpO2 93%   BMI 27.53 kg/m  , BMI Body mass index is 27.53 kg/m. GEN: Well nourished, well developed, in no acute distress  HEENT: normal  Neck: no JVD, carotid bruits, or masses Cardiac: RRR; no murmurs, rubs, or gallops,no edema  Respiratory:  clear to auscultation bilaterally, normal work of breathing GI: soft, nontender, nondistended, + BS MS: no deformity or atrophy  Skin: warm and dry Neuro:  Strength and sensation are intact Psych: euthymic mood, full affect  EKG:  EKG is not ordered today. Personal review of the ekg ordered 07/01/20 shows sinus rhythm, first-degree AV block, left bundle branch block  Recent Labs: 05/11/2020: Magnesium 2.1; NT-Pro BNP 2,980 05/17/2020: Hemoglobin 13.5; Platelets 181 05/23/2020: BUN 32; Creatinine, Ser 1.62; Potassium 5.0; Sodium 136    Lipid Panel     Component Value Date/Time   CHOL 165 11/24/2018 0914   TRIG 67 11/24/2018 0914   HDL 77 11/24/2018 0914   CHOLHDL 2.1 11/24/2018 0914   LDLCALC 75 11/24/2018 0914     Wt Readings from Last 3 Encounters:  07/11/20 203 lb  (92.1 kg)  07/01/20 202 lb 9.6 oz (91.9 kg)  06/15/20 203 lb 12.8 oz (92.4 kg)      Other studies Reviewed: Additional studies/ records that were reviewed today include: TTE 03/13/2018 Review of the above records today demonstrates:   1. The left ventricle has severely reduced systolic function, with an ejection fraction of 20-25%. The cavity size was normal. Left ventricular diastolic Doppler parameters are consistent with pseudonormalization.  2. The right ventricle has normal systolic function. The cavity was normal. There is no increase in right ventricular wall thickness.  3. Left atrial size was mildly dilated.  4. The mitral valve is normal in structure. Mitral valve regurgitation is mild to moderate by color flow Doppler.  5. The tricuspid valve is normal in structure.  6. The aortic valve is tricuspid Mild calcification of the aortic  valve.  7. The pulmonic valve was normal in structure.  8. Severe akinesis of the left ventricular septal.  Left heart catheterization 5-22 Ost LM to Mid LM lesion is 30% stenosed. Mid LM to Dist LM lesion is 55% stenosed. Prox Cx lesion is 30% stenosed. Ost RCA lesion is 55% stenosed. ------------------------------------------------- LV end diastolic pressure is moderately elevated. Hemodynamic findings consistent with moderate pulmonary hypertension.  Cardiac monitor 06/22/2020 personally reviewed Predominant rhythm was sinus rhythm Up to 25% burden of atrial fibrillation and atrial flutter Rare ventricular ectopy Pauses of up to 3.8 seconds during atrial fibrillation/flutter  ASSESSMENT AND PLAN:  1.  Chronic systolic heart failure due to ischemic cardiomyopathy: NYHA class II symptoms.  Currently on Entresto and beta-blockers.  He has some shortness of breath and fatigue.  We Bran Aldridge plan for CRT-P implant on 12/26/2020.    2.  Coronary artery disease: No current chest pain.  Plan per primary cardiology.  3.  Hyperlipidemia: Continue statin  per primary cardiology  4.  Atrial flutter: Appears atypical.  Currently on Eliquis with a CHA2DS2-VASc of 4.  Was started on amiodarone at last visit.  High risk medication monitoring.  Has remained in sinus rhythm.   Current medicines are reviewed at length with the patient today.   The patient does not have concerns regarding his medicines.  The following changes were made today: None  Labs/ tests ordered today include:  Orders Placed This Encounter  Procedures   Basic metabolic panel   CBC      Disposition:   FU with Darrel Baroni 3 months   Signed, Evyn Putzier Meredith Leeds, MD  07/11/2020 1:14 PM     [facility] [address] [place] [address] [phone] (office) [phone] (fax)

## 2020-07-12 LAB — CBC
Hematocrit: 44.7 % (ref 37.5–51.0)
Hemoglobin: 14.9 g/dL (ref 13.0–17.7)
MCH: 31.5 pg (ref 26.6–33.0)
MCHC: 33.3 g/dL (ref 31.5–35.7)
MCV: 95 fL (ref 79–97)
Platelets: 215 10*3/uL (ref 150–450)
RBC: 4.73 x10E6/uL (ref 4.14–5.80)
RDW: 12.7 % (ref 11.6–15.4)
WBC: 8.5 10*3/uL (ref 3.4–10.8)

## 2020-07-12 LAB — BASIC METABOLIC PANEL
BUN/Creatinine Ratio: 21 (ref 10–24)
BUN: 33 mg/dL — ABNORMAL HIGH (ref 8–27)
CO2: 25 mmol/L (ref 20–29)
Calcium: 8.5 mg/dL — ABNORMAL LOW (ref 8.6–10.2)
Chloride: 101 mmol/L (ref 96–106)
Creatinine, Ser: 1.55 mg/dL — ABNORMAL HIGH (ref 0.76–1.27)
Glucose: 87 mg/dL (ref 65–99)
Potassium: 4.6 mmol/L (ref 3.5–5.2)
Sodium: 141 mmol/L (ref 134–144)
eGFR: 45 mL/min/{1.73_m2} — ABNORMAL LOW (ref >59)

## 2020-07-25 NOTE — Pre-Procedure Instructions (Signed)
Attempted to call patient regarding procedure instructions for tomorrow's procedure.  No answer

## 2020-07-26 ENCOUNTER — Ambulatory Visit (HOSPITAL_COMMUNITY)
Admission: RE | Admit: 2020-07-26 | Discharge: 2020-07-26 | Disposition: A | Discharge status: Home / Self Care | Payer: Medicare Other | Attending: Cardiology | Admitting: Cardiology

## 2020-07-26 ENCOUNTER — Encounter (HOSPITAL_COMMUNITY)
Admission: RE | Disposition: A | Discharge status: Home / Self Care | Payer: Self-pay | Source: Home / Self Care | Attending: Cardiology

## 2020-07-26 ENCOUNTER — Ambulatory Visit (HOSPITAL_COMMUNITY): Payer: Medicare Other

## 2020-07-26 ENCOUNTER — Other Ambulatory Visit: Payer: Self-pay

## 2020-07-26 DIAGNOSIS — Z950 Presence of cardiac pacemaker: Secondary | ICD-10-CM | POA: Diagnosis not present

## 2020-07-26 DIAGNOSIS — I255 Ischemic cardiomyopathy: Secondary | ICD-10-CM | POA: Diagnosis not present

## 2020-07-26 DIAGNOSIS — I447 Left bundle-branch block, unspecified: Secondary | ICD-10-CM | POA: Insufficient documentation

## 2020-07-26 DIAGNOSIS — I5042 Chronic combined systolic (congestive) and diastolic (congestive) heart failure: Secondary | ICD-10-CM | POA: Diagnosis not present

## 2020-07-26 DIAGNOSIS — Z95818 Presence of other cardiac implants and grafts: Secondary | ICD-10-CM

## 2020-07-26 DIAGNOSIS — I2510 Atherosclerotic heart disease of native coronary artery without angina pectoris: Secondary | ICD-10-CM | POA: Diagnosis not present

## 2020-07-26 DIAGNOSIS — I110 Hypertensive heart disease with heart failure: Secondary | ICD-10-CM | POA: Insufficient documentation

## 2020-07-26 DIAGNOSIS — I517 Cardiomegaly: Secondary | ICD-10-CM | POA: Diagnosis not present

## 2020-07-26 DIAGNOSIS — I5022 Chronic systolic (congestive) heart failure: Secondary | ICD-10-CM | POA: Diagnosis not present

## 2020-07-26 HISTORY — PX: BIV PACEMAKER INSERTION CRT-P: EP1199

## 2020-07-26 SURGERY — BIV PACEMAKER INSERTION CRT-P

## 2020-07-26 MED ORDER — MIDAZOLAM HCL 5 MG/5ML IJ SOLN
INTRAMUSCULAR | Status: DC | PRN
  Administered 2020-07-26 (×4): 1 mg via INTRAVENOUS

## 2020-07-26 MED ORDER — LIDOCAINE HCL (PF) 1 % IJ SOLN
INTRAMUSCULAR | Status: AC
  Filled 2020-07-26: qty 60

## 2020-07-26 MED ORDER — MIDAZOLAM HCL 5 MG/5ML IJ SOLN
INTRAMUSCULAR | Status: AC
  Filled 2020-07-26: qty 5

## 2020-07-26 MED ORDER — CEFAZOLIN SODIUM-DEXTROSE 1-4 GM/50ML-% IV SOLN
1.0000 g | Freq: Four times a day (QID) | INTRAVENOUS | Status: DC
Start: 2020-07-26 — End: 2020-07-27
  Administered 2020-07-26: 1 g via INTRAVENOUS

## 2020-07-26 MED ORDER — CEFAZOLIN SODIUM-DEXTROSE 2-4 GM/100ML-% IV SOLN
2.0000 g | INTRAVENOUS | Status: AC
  Administered 2020-07-26: 2 g via INTRAVENOUS
  Filled 2020-07-26: qty 100

## 2020-07-26 MED ORDER — LIDOCAINE HCL (PF) 1 % IJ SOLN
INTRAMUSCULAR | Status: AC
  Filled 2020-07-26: qty 30

## 2020-07-26 MED ORDER — SODIUM CHLORIDE 0.9 % IV SOLN
INTRAVENOUS | Status: AC
  Filled 2020-07-26: qty 2

## 2020-07-26 MED ORDER — ONDANSETRON HCL 4 MG/2ML IJ SOLN: 4.0000 mg | Freq: Four times a day (QID) | INTRAMUSCULAR | Status: DC | PRN

## 2020-07-26 MED ORDER — LIDOCAINE HCL (PF) 1 % IJ SOLN
INTRAMUSCULAR | Status: DC | PRN
  Administered 2020-07-26: 60 mL
  Administered 2020-07-26: 30 mL

## 2020-07-26 MED ORDER — HEPARIN (PORCINE) IN NACL 1000-0.9 UT/500ML-% IV SOLN
INTRAVENOUS | Status: AC
  Filled 2020-07-26: qty 500

## 2020-07-26 MED ORDER — SODIUM CHLORIDE 0.9 % IV SOLN: INTRAVENOUS | Status: DC

## 2020-07-26 MED ORDER — IOHEXOL 350 MG/ML SOLN
INTRAVENOUS | Status: DC | PRN
  Administered 2020-07-26: 20 mL

## 2020-07-26 MED ORDER — HEPARIN (PORCINE) IN NACL 1000-0.9 UT/500ML-% IV SOLN
INTRAVENOUS | Status: DC | PRN
  Administered 2020-07-26: 500 mL

## 2020-07-26 MED ORDER — CEFAZOLIN SODIUM-DEXTROSE 2-4 GM/100ML-% IV SOLN
INTRAVENOUS | Status: AC
  Filled 2020-07-26: qty 100

## 2020-07-26 MED ORDER — FENTANYL CITRATE (PF) 100 MCG/2ML IJ SOLN
INTRAMUSCULAR | Status: AC
  Filled 2020-07-26: qty 2

## 2020-07-26 MED ORDER — SODIUM CHLORIDE 0.9 % IV SOLN
80.0000 mg | INTRAVENOUS | Status: AC
  Administered 2020-07-26: 80 mg
  Filled 2020-07-26: qty 2

## 2020-07-26 MED ORDER — ACETAMINOPHEN 325 MG PO TABS
325.0000 mg | ORAL_TABLET | ORAL | Status: DC | PRN
Start: 2020-07-26 — End: 2020-07-27
  Filled 2020-07-26: qty 2

## 2020-07-26 MED ORDER — FENTANYL CITRATE (PF) 100 MCG/2ML IJ SOLN
INTRAMUSCULAR | Status: DC | PRN
  Administered 2020-07-26 (×4): 25 ug via INTRAVENOUS

## 2020-07-26 SURGICAL SUPPLY — 19 items
BALLN COR SINUS VENO 6FR 80 (BALLOONS) ×2
BALLOON COR SINUS VENO 6FR 80 (BALLOONS) ×1 IMPLANT
CABLE SURGICAL S-101-97-12 (CABLE) ×2 IMPLANT
CATH CPS DIRECT 135 DS2C020 (CATHETERS) ×2 IMPLANT
CATH CPS QUART CN DS2N029-65 (CATHETERS) ×2 IMPLANT
CPS IMPLANT KIT 410190 (MISCELLANEOUS) ×2 IMPLANT
GUIDEWIRE ANGLED .035X150CM (WIRE) ×4 IMPLANT
KIT MICROPUNCTURE NIT STIFF (SHEATH) ×2 IMPLANT
LEAD QUARTET 1458QL-86 (Lead) ×1 IMPLANT
LEAD TENDRIL MRI 52CM LPA1200M (Lead) ×2 IMPLANT
LEAD TENDRIL MRI 58CM LPA1200M (Lead) ×2 IMPLANT
PACEMAKER QUDR ALLR CRT PM3562 (Pacemaker) ×1 IMPLANT
PAD PRO RADIOLUCENT 2001M-C (PAD) ×2 IMPLANT
PMKR QUADRA ALLURE CRT PM3562 (Pacemaker) ×2 IMPLANT
QUARTET 1458QL-86 (Lead) ×2 IMPLANT
SHEATH 8FR PRELUDE SNAP 13 (SHEATH) ×4 IMPLANT
TRAY PACEMAKER INSERTION (PACKS) ×2 IMPLANT
WIRE ACUITY WHISPER EDS 4648 (WIRE) ×2 IMPLANT
WIRE HI TORQ VERSACORE-J 145CM (WIRE) ×2 IMPLANT

## 2020-07-26 NOTE — Progress Notes (Signed)
Dr Curt Bears notified of CXR results and ok to d/c home; per Dr Curt Bears client and his wife notified to resume eliquis tomorrow night and they voiced understanding

## 2020-07-26 NOTE — Interval H&P Note (Signed)
History and Physical Interval Note:  07/26/2020 11:40 AM  Glenn Sullivan  has presented today for surgery, with the diagnosis of heart failure.  The various methods of treatment have been discussed with the patient and family. After consideration of risks, benefits and other options for treatment, the patient has consented to  Procedure(s): BIV PACEMAKER INSERTION CRT-P (N/A) as a surgical intervention.  The patient's history has been reviewed, patient examined, no change in status, stable for surgery.  I have reviewed the patient's chart and labs.  Questions were answered to the patient's satisfaction.     Ever Gustafson [employer]

## 2020-07-27 ENCOUNTER — Encounter (HOSPITAL_COMMUNITY): Payer: Self-pay | Admitting: Cardiology

## 2020-07-27 ENCOUNTER — Telehealth: Payer: Self-pay

## 2020-07-27 NOTE — Telephone Encounter (Signed)
Follow-up after same day discharge: Implant date: 07/26/20 MD: Allegra Lai, MD Device:  [id number] [place] [phone] (Bi-V) Location: Left Chest   Wound check visit: 08/10/20 at 10:00 90 day MD follow-up: 10/31/20 at 8:30 [place]  Remote Transmission received: No. Patient/wife provided instructions to send manual transmission however patient eating at the time. Remote monitor is communicating with device 07/27/20.  Dressing removed: yes  Successful telephone encounter with patient and wife. Sling removed this am however patient put back on secondary to pain at site. Patient encouraged to remove sling and may take tylenol for discomfort unless been told otherwise by another provider. Steri-strips intact. Reminded of restrictions provided at discharge. C/O mild swelling at site but no drainage or discoloration. Encouraged ice pack for swelling. Confirmed upcoming appointments. Provided number to device clinic if any additional questions or concerns arise.

## 2020-07-27 NOTE — Telephone Encounter (Signed)
-----   Message from Shirley Friar, PA-C sent at 07/26/2020  3:38 PM EDT ----- Same day discharge PPM Dr. Curt Bears

## 2020-08-10 ENCOUNTER — Other Ambulatory Visit: Payer: Self-pay

## 2020-08-10 ENCOUNTER — Ambulatory Visit (INDEPENDENT_AMBULATORY_CARE_PROVIDER_SITE_OTHER): Payer: Medicare Other

## 2020-08-10 DIAGNOSIS — I5042 Chronic combined systolic (congestive) and diastolic (congestive) heart failure: Secondary | ICD-10-CM | POA: Diagnosis not present

## 2020-08-10 DIAGNOSIS — I420 Dilated cardiomyopathy: Secondary | ICD-10-CM

## 2020-08-10 LAB — CUP PACEART INCLINIC DEVICE CHECK
Battery Remaining Longevity: 55 mo
Battery Voltage: 3.05 V
Brady Statistic RA Percent Paced: 52 %
Brady Statistic RV Percent Paced: 99.85 %
Date Time Interrogation Session: 20220727101213
Implantable Lead Implant Date: 20220712
Implantable Lead Implant Date: 20220712
Implantable Lead Implant Date: 20220712
Implantable Lead Location: 753858
Implantable Lead Location: 753859
Implantable Lead Location: 753860
Implantable Pulse Generator Implant Date: 20220712
Lead Channel Impedance Value: 450 Ohm
Lead Channel Impedance Value: 675 Ohm
Lead Channel Impedance Value: 862.5 Ohm
Lead Channel Pacing Threshold Amplitude: 0.75 V
Lead Channel Pacing Threshold Amplitude: 0.75 V
Lead Channel Pacing Threshold Amplitude: 1.25 V
Lead Channel Pacing Threshold Pulse Width: 0.5 ms
Lead Channel Pacing Threshold Pulse Width: 0.5 ms
Lead Channel Pacing Threshold Pulse Width: 0.5 ms
Lead Channel Sensing Intrinsic Amplitude: 12 mV
Lead Channel Sensing Intrinsic Amplitude: 5 mV
Lead Channel Setting Pacing Amplitude: 3.5 V
Lead Channel Setting Pacing Amplitude: 3.5 V
Lead Channel Setting Pacing Amplitude: 3.5 V
Lead Channel Setting Pacing Pulse Width: 0.5 ms
Lead Channel Setting Pacing Pulse Width: 0.5 ms
Lead Channel Setting Sensing Sensitivity: 2 mV
Pulse Gen Model: 3562
Pulse Gen Serial Number: 3933180

## 2020-08-10 NOTE — Progress Notes (Signed)
Wound check appointment. Steri-strips removed. Wound without redness or edema. Incision edges approximated, wound well healed. Normal device function. Thresholds, sensing, and impedances consistent with implant measurements. Device programmed at 3.5V/auto capture programmed on for extra safety margin until 3 month visit. Histogram distribution appropriate for patient and level of activity. No mode switches or high ventricular rates noted. BVP pacing > 99%. Patient educated about wound care, arm mobility, lifting restrictions. ROV in 3 months with Dr. Curt Bears [place].

## 2020-08-10 NOTE — Patient Instructions (Signed)

## 2020-08-11 ENCOUNTER — Other Ambulatory Visit: Payer: Self-pay | Admitting: Cardiology

## 2020-08-24 DIAGNOSIS — I25118 Atherosclerotic heart disease of native coronary artery with other forms of angina pectoris: Secondary | ICD-10-CM | POA: Diagnosis not present

## 2020-08-24 DIAGNOSIS — I255 Ischemic cardiomyopathy: Secondary | ICD-10-CM | POA: Diagnosis not present

## 2020-08-24 DIAGNOSIS — I5043 Acute on chronic combined systolic (congestive) and diastolic (congestive) heart failure: Secondary | ICD-10-CM | POA: Diagnosis not present

## 2020-08-24 DIAGNOSIS — R059 Cough, unspecified: Secondary | ICD-10-CM | POA: Diagnosis not present

## 2020-08-24 DIAGNOSIS — I4892 Unspecified atrial flutter: Secondary | ICD-10-CM | POA: Diagnosis not present

## 2020-09-07 ENCOUNTER — Other Ambulatory Visit: Payer: Self-pay | Admitting: Cardiology

## 2020-09-07 NOTE — Telephone Encounter (Signed)
Prescription refill request for Eliquis received. Indication: a flutter Last office visit: 07/11/20 Scr: 1.55 Age: 80 Weight: 92 kg  Patient needs dose decrease.  Called patient and left message on machine

## 2020-09-08 DIAGNOSIS — K2270 Barrett's esophagus without dysplasia: Secondary | ICD-10-CM | POA: Diagnosis not present

## 2020-09-08 DIAGNOSIS — R059 Cough, unspecified: Secondary | ICD-10-CM | POA: Diagnosis not present

## 2020-09-08 DIAGNOSIS — T17928A Food in respiratory tract, part unspecified causing other injury, initial encounter: Secondary | ICD-10-CM | POA: Diagnosis not present

## 2020-09-08 DIAGNOSIS — K219 Gastro-esophageal reflux disease without esophagitis: Secondary | ICD-10-CM | POA: Diagnosis not present

## 2020-10-11 NOTE — Progress Notes (Signed)
Cardiology Office Note:    Date:  10/12/2020   ID:  Glenn Sullivan, DOB [birth year], MRN [id number]  PCP:  Garwin Brothers, MD  Cardiologist:  Shirlee More, MD    Referring MD: Garwin Brothers, MD    ASSESSMENT:    1. Chronic systolic (congestive) heart failure ([place])   2. LBBB (left bundle branch block)   3. Status post biventricular pacemaker   4. Paroxysmal atrial fibrillation (HCC)   5. On amiodarone therapy   6. Chronic anticoagulation   7. Coronary artery disease of native artery of native heart with stable angina pectoris ([place])    PLAN:    In order of problems listed above:  Stable he is on a good medical regimen including his loop diuretic beta-blocker maximally tolerated dose of Entresto and unfortunately is intolerant of SGLT2 inhibitor with CKD I would not put him on MRA.  I think activity would be helpful if symptoms are not improved or worsened we could consider vericiquat Improved now with CRT follow-up in device clinic Maintaining sinus rhythm on low-dose amiodarone 1 to be sure we check his labs every 6 months and I will cycle him today including liver function thyroids Continue his anticoagulation Stable severe CAD having no angina he is declined bypass surgery and will continue medical treatment including aspirin beta-blocker and statin   Next appointment: 3 months consider repeat echocardiogram around the time   Medication Adjustments/Labs and Tests Ordered: Current medicines are reviewed at length with the patient today.  Concerns regarding medicines are outlined above.  No orders of the defined types were placed in this encounter.  No orders of the defined types were placed in this encounter.   Chief Complaint  Patient presents with   Follow-up   Congestive Heart Failure    History of Present Illness:    Glenn Sullivan is a 80 y.o. male with a hx of CAD heart failure with reduced ejection fraction hypertensive heart disease left bundle branch block  hyperlipidemia and peripheral arterial disease. He underwent coronary angiography showing heavily calcified left main diffuse disease with 50% distal stenosis just prior to the trifurcation and mild diffuse disease in the left coronary artery and left main and calcified ostial LAD 50 to 60% stenosis.  He was seen by cardiothoracic surgery but adamantly declined surgical intervention for his multivessel CAD.  He was in atrial fibrillation with scheduled for outpatient cardioversion and spontaneously converted to sinus rhythm.  He subsequent has been seen by EP and is scheduled for biventricular pacemaker in July.  His most recent echocardiogram 04/23/2019 showed EF of 20 to 25% with severe dilation severe left atrial enlargement mild mitral regurgitation and dilation of ascending aorta 41 mm.  He wants last seen 07/01/2020.  Ventricular pacemaker CRT/P inserted 07/26/2020  Compliance with diet, lifestyle and medications: Yes  He was intolerant of SGLT2 inhibitor with intense diarrhea He has had no angina He has no edema orthopnea palpitation or syncope overall feels better following with CRT He does have exertional shortness of breath particularly when he tries to do incline like coming up his driveway  He is not doing regular activity challenging to get back to walking cycling 20 to 30 minutes every day at this point and offered cardiac rehabilitation he declined He is not weighing daily at home reinforced sodium restriction Is having no symptoms of left arm claudication Past Medical History:  Diagnosis Date   AAA (abdominal aortic aneurysm) without rupture ([place]) 12/24/2013   Barrett's esophagus  Cardiomyopathy ([place]) 03/07/2018   EF 35% in 2017   Chronic combined systolic and diastolic CHF (congestive heart failure) ([place]) 04/25/2018   Coronary artery disease 03/07/2018   Left heart cath 2017:Conclusions Peripheral Procedure Description Patent stent graft in abdominal aorta Patent Renal artery  stents Diagnostic Procedure Summary Diffuse Mild to Moderate calcific coronary artery disease. IFR in the RCA was 1.0 after hyperemic response with adenosine. Moderate global LV systolic dysfunction. LV ejection fraction is 35% Diagnostic Procedure Recommendations Medical the   Diarrhea 04/28/2018   Dyslipidemia 10/28/2015   H/O aortic aneurysm repair 10/28/2015   Hypertensive heart disease with heart failure ([place]) 03/07/2018   LBBB (left bundle branch block) 04/25/2018   QRSD 180 msec   Myocardial infarction [facility])     Past Surgical History:  Procedure Laterality Date   ABDOMINAL AORTIC ANEURYSM REPAIR     BIV PACEMAKER INSERTION CRT-P N/A 07/26/2020   Procedure: BIV PACEMAKER INSERTION CRT-P;  Surgeon: Constance Haw, MD;  Location: [facility];  Service: Cardiovascular;  Laterality: N/A;   EVAR     RENAL ARTERY STENT     RIGHT/LEFT HEART CATH AND CORONARY ANGIOGRAPHY N/A 05/16/2020   Procedure: RIGHT/LEFT HEART CATH AND CORONARY ANGIOGRAPHY;  Surgeon: Leonie Man, MD;  Location: [place] [facility];  Service: Cardiovascular;  Laterality: N/A;    Current Medications: Current Meds  Medication Sig   amiodarone (PACERONE) 200 MG tablet Take 1 tablet (200 mg total) by mouth daily.   apixaban (ELIQUIS) 2.5 MG TABS tablet Take 1 tablet (2.5 mg total) by mouth 2 (two) times daily.   aspirin EC 81 MG tablet Take 81 mg by mouth daily.   atorvastatin (LIPITOR) 40 MG tablet Take 1 tablet (40 mg total) by mouth daily.   carvedilol (COREG) 3.125 MG tablet TAKE 1 TABLET (3.125 MG TOTAL) BY MOUTH 2 (TWO) TIMES DAILY WITH A MEAL.   fluticasone (FLONASE) 50 MCG/ACT nasal spray Place 1 spray into both nostrils daily as needed for allergies.   gabapentin (NEURONTIN) 300 MG capsule Take 300 mg by mouth at bedtime as needed (muscle spasm in back).   omeprazole (PRILOSEC) 20 MG capsule Take 20 mg by mouth daily.   sacubitril-valsartan (ENTRESTO) 97-103 MG Take 1 tablet by mouth 2 (two) times  daily.   torsemide (DEMADEX) 20 MG tablet TAKE 2 TABLETS BY MOUTH DAILY     Allergies:   Farxiga [dapagliflozin], Tetanus toxoids, and Lisinopril   Social History   Socioeconomic History   Marital status: Married    Spouse name: Not on file   Number of children: Not on file   Years of education: Not on file   Highest education level: Not on file  Occupational History   Not on file  Tobacco Use   Smoking status: Former    Types: Cigarettes    Quit date: 01/16/2007    Years since quitting: 13.7   Smokeless tobacco: Never  Vaping Use   Vaping Use: Never used  Substance and Sexual Activity   Alcohol use: Yes    Comment: occ   Drug use: No   Sexual activity: Not on file  Other Topics Concern   Not on file  Social History Narrative   Not on file   Social Determinants of Health   Financial Resource Strain: Not on file  Food Insecurity: Not on file  Transportation Needs: Not on file  Physical Activity: Not on file  Stress: Not on file  Social Connections: Not on  file     Family History: The patient's family history includes Diabetes in his mother; Heart disease in his mother; Hypertension in his mother; Varicose Veins in his mother. ROS:   Please see the history of present illness.    All other systems reviewed and are negative.  EKGs/Labs/Other Studies Reviewed:    The following studies were reviewed today:  EKG:  EKG ordered today and personally reviewed.  The ekg ordered today demonstrates atrial sensed ventricular paced narrow QRS CRT morphology 100% of the time  Recent Labs: 05/11/2020: Magnesium 2.1; NT-Pro BNP 2,980 07/11/2020: BUN 33; Creatinine, Ser 1.55; Hemoglobin 14.9; Platelets 215; Potassium 4.6; Sodium 141  Recent Lipid Panel    Component Value Date/Time   CHOL 165 11/24/2018 0914   TRIG 67 11/24/2018 0914   HDL 77 11/24/2018 0914   CHOLHDL 2.1 11/24/2018 0914   LDLCALC 75 11/24/2018 0914    Physical Exam:    VS:  BP (!) 63/46 (BP Location:  Left Arm, Patient Position: Sitting, Cuff Size: Normal)   Pulse 66   Ht 6' (1.829 m)   Wt 212 lb 9.6 oz (96.4 kg)   SpO2 95%   BMI 28.83 kg/m     Wt Readings from Last 3 Encounters:  10/12/20 212 lb 9.6 oz (96.4 kg)  07/26/20 203 lb (92.1 kg)  07/11/20 203 lb (92.1 kg)     GEN:  Well nourished, well developed in no acute distress HEENT: Normal NECK: No JVD; No carotid bruits LYMPHATICS: No lymphadenopathy CARDIAC: RRR, no murmurs, rubs, gallops RESPIRATORY:  Clear to auscultation without rales, wheezing or rhonchi  ABDOMEN: Soft, non-tender, non-distended MUSCULOSKELETAL:  No edema; No deformity  SKIN: Warm and dry NEUROLOGIC:  Alert and oriented x 3 PSYCHIATRIC:  Normal affect    Signed, Shirlee More, MD  10/12/2020 9:07 AM    [place]

## 2020-10-12 ENCOUNTER — Other Ambulatory Visit: Payer: Self-pay

## 2020-10-12 ENCOUNTER — Ambulatory Visit (INDEPENDENT_AMBULATORY_CARE_PROVIDER_SITE_OTHER): Payer: Medicare Other | Admitting: Cardiology

## 2020-10-12 ENCOUNTER — Encounter: Payer: Self-pay | Admitting: Cardiology

## 2020-10-12 VITALS — BP 63/46 | HR 66 | Ht 72.0 in | Wt 212.6 lb

## 2020-10-12 DIAGNOSIS — Z950 Presence of cardiac pacemaker: Secondary | ICD-10-CM

## 2020-10-12 DIAGNOSIS — I447 Left bundle-branch block, unspecified: Secondary | ICD-10-CM

## 2020-10-12 DIAGNOSIS — E785 Hyperlipidemia, unspecified: Secondary | ICD-10-CM | POA: Diagnosis not present

## 2020-10-12 DIAGNOSIS — I25118 Atherosclerotic heart disease of native coronary artery with other forms of angina pectoris: Secondary | ICD-10-CM | POA: Diagnosis not present

## 2020-10-12 DIAGNOSIS — I480 Paroxysmal atrial fibrillation: Secondary | ICD-10-CM | POA: Diagnosis not present

## 2020-10-12 DIAGNOSIS — Z7901 Long term (current) use of anticoagulants: Secondary | ICD-10-CM

## 2020-10-12 DIAGNOSIS — I5022 Chronic systolic (congestive) heart failure: Secondary | ICD-10-CM

## 2020-10-12 DIAGNOSIS — Z79899 Other long term (current) drug therapy: Secondary | ICD-10-CM | POA: Diagnosis not present

## 2020-10-12 NOTE — Patient Instructions (Signed)
Medication Instructions:  No medication changes. *If you need a refill on your cardiac medications before your next appointment, please call your pharmacy*   Lab Work: Your physician recommends that you have labs done in the office today. Your test included complete metabolic panel, TSH, T4, T3 and lipids.  If you have labs (blood work) drawn today and your tests are completely normal, you will receive your results only by: [place] (if you have MyChart) OR A paper copy in the mail If you have any lab test that is abnormal or we need to change your treatment, we will call you to review the results.   Testing/Procedures: None ordered   Follow-Up: At [facility], you and your health needs are our priority.  As part of our continuing mission to provide you with exceptional heart care, we have created designated Provider Care Teams.  These Care Teams include your primary Cardiologist (physician) and Advanced Practice Providers (APPs -  Physician Assistants and Nurse Practitioners) who all work together to provide you with the care you need, when you need it.  We recommend signing up for the patient portal called "MyChart".  Sign up information is provided on this After Visit Summary.  MyChart is used to connect with patients for Virtual Visits (Telemedicine).  Patients are able to view lab/test results, encounter notes, upcoming appointments, etc.  Non-urgent messages can be sent to your provider as well.   To learn more about what you can do with MyChart, go to [email or web address].    Your next appointment:   4 month(s)  The format for your next appointment:   In Person  Provider:   Shirlee More, MD   Other Instructions Weight daily  Do 20-30 minutes activity daily.

## 2020-10-13 LAB — COMPREHENSIVE METABOLIC PANEL WITH GFR
ALT: 11 IU/L (ref 0–44)
AST: 16 IU/L (ref 0–40)
Albumin/Globulin Ratio: 2 (ref 1.2–2.2)
Albumin: 4.1 g/dL (ref 3.6–4.6)
Alkaline Phosphatase: 83 IU/L (ref 44–121)
BUN/Creatinine Ratio: 13 (ref 10–24)
BUN: 21 mg/dL (ref 8–27)
Bilirubin Total: 0.5 mg/dL (ref 0.0–1.2)
CO2: 24 mmol/L (ref 20–29)
Calcium: 8.3 mg/dL — ABNORMAL LOW (ref 8.6–10.2)
Chloride: 100 mmol/L (ref 96–106)
Creatinine, Ser: 1.66 mg/dL — ABNORMAL HIGH (ref 0.76–1.27)
Globulin, Total: 2.1 g/dL (ref 1.5–4.5)
Glucose: 66 mg/dL — ABNORMAL LOW (ref 70–99)
Potassium: 4.7 mmol/L (ref 3.5–5.2)
Sodium: 139 mmol/L (ref 134–144)
Total Protein: 6.2 g/dL (ref 6.0–8.5)
eGFR: 41 mL/min/1.73 — ABNORMAL LOW (ref >59)

## 2020-10-13 LAB — LIPID PANEL
Chol/HDL Ratio: 2.3 ratio (ref 0.0–5.0)
Cholesterol, Total: 174 mg/dL (ref 100–199)
HDL: 77 mg/dL (ref >39)
LDL Chol Calc (NIH): 80 mg/dL (ref 0–99)
Triglycerides: 93 mg/dL (ref 0–149)
VLDL Cholesterol Cal: 17 mg/dL (ref 5–40)

## 2020-10-13 LAB — TSH: TSH: 6.35 u[IU]/mL — ABNORMAL HIGH (ref 0.450–4.500)

## 2020-10-13 LAB — T3: T3, Total: 67 ng/dL — ABNORMAL LOW (ref 71–180)

## 2020-10-13 LAB — T4: T4, Total: 9.1 ug/dL (ref 4.5–12.0)

## 2020-10-17 DIAGNOSIS — Z23 Encounter for immunization: Secondary | ICD-10-CM | POA: Diagnosis not present

## 2020-10-26 ENCOUNTER — Ambulatory Visit (INDEPENDENT_AMBULATORY_CARE_PROVIDER_SITE_OTHER): Payer: Medicare Other

## 2020-10-26 DIAGNOSIS — I447 Left bundle-branch block, unspecified: Secondary | ICD-10-CM

## 2020-10-27 LAB — CUP PACEART REMOTE DEVICE CHECK
Battery Remaining Longevity: 50 mo
Battery Remaining Percentage: 95.5 %
Battery Voltage: 2.99 V
Brady Statistic AP VP Percent: 55 %
Brady Statistic AP VS Percent: 1 %
Brady Statistic AS VP Percent: 44 %
Brady Statistic AS VS Percent: 1 %
Brady Statistic RA Percent Paced: 54 %
Date Time Interrogation Session: 20221012040010
Implantable Lead Implant Date: 20220712
Implantable Lead Implant Date: 20220712
Implantable Lead Implant Date: 20220712
Implantable Lead Location: 753858
Implantable Lead Location: 753859
Implantable Lead Location: 753860
Implantable Pulse Generator Implant Date: 20220712
Lead Channel Impedance Value: 1000 Ohm
Lead Channel Impedance Value: 440 Ohm
Lead Channel Impedance Value: 560 Ohm
Lead Channel Pacing Threshold Amplitude: 0.75 V
Lead Channel Pacing Threshold Amplitude: 0.75 V
Lead Channel Pacing Threshold Amplitude: 1.25 V
Lead Channel Pacing Threshold Pulse Width: 0.5 ms
Lead Channel Pacing Threshold Pulse Width: 0.5 ms
Lead Channel Pacing Threshold Pulse Width: 0.5 ms
Lead Channel Sensing Intrinsic Amplitude: 12 mV
Lead Channel Sensing Intrinsic Amplitude: 4.3 mV
Lead Channel Setting Pacing Amplitude: 3.5 V
Lead Channel Setting Pacing Amplitude: 3.5 V
Lead Channel Setting Pacing Amplitude: 3.5 V
Lead Channel Setting Pacing Pulse Width: 0.5 ms
Lead Channel Setting Pacing Pulse Width: 0.5 ms
Lead Channel Setting Sensing Sensitivity: 2 mV
Pulse Gen Model: 3562
Pulse Gen Serial Number: 3933180

## 2020-10-30 NOTE — Progress Notes (Signed)
Electrophysiology Office Note   Date:  10/31/2020   ID:  Glenn Sullivan, DOB Sep 02, 1940, MRN [id number]  PCP:  Glenn Brothers, MD  Cardiologist:  Glenn Sullivan Primary Electrophysiologist:  Glenn Richter Meredith Leeds, MD    No chief complaint on file.    History of Present Illness: Glenn Sullivan is a 80 y.o. male who is being seen today for the evaluation of CHF at the request of Glenn Brothers, MD. Presenting today for electrophysiology evaluation.  He has a history significant for coronary artery disease, chronic systolic heart failure, hypertension, left bundle branch block.  He is currently on Entresto and carvedilol.  He was noted to be in atrial fibrillation feeling quite poorly.  He was started on amiodarone.  He is now status post [place] CRT-P implanted 07/26/2020.  Today, denies symptoms of palpitations, chest pain, shortness of breath, orthopnea, PND, lower extremity edema, claudication, dizziness, presyncope, syncope, bleeding, or neurologic sequela. The patient is tolerating medications without difficulties.  Since being seen he has done well.  He does continue to have some shortness of breath.  On device interrogation, his LV lead threshold has gone up and he is not consistently capturing.  He gets short of breath when he walks to the mailbox, but otherwise feels well at rest.   Past Medical History:  Diagnosis Date   AAA (abdominal aortic aneurysm) without rupture 12/24/2013   Barrett's esophagus    Cardiomyopathy ([place]) 03/07/2018   EF 35% in 2017   Chronic combined systolic and diastolic CHF (congestive heart failure) ([place]) 04/25/2018   Coronary artery disease 03/07/2018   Left heart cath 2017:Conclusions Peripheral Procedure Description Patent stent graft in abdominal aorta Patent Renal artery stents Diagnostic Procedure Summary Diffuse Mild to Moderate calcific coronary artery disease. IFR in the RCA was 1.0 after hyperemic response with adenosine. Moderate global LV systolic  dysfunction. LV ejection fraction is 35% Diagnostic Procedure Recommendations Medical the   Diarrhea 04/28/2018   Dyslipidemia 10/28/2015   H/O aortic aneurysm repair 10/28/2015   Hypertensive heart disease with heart failure ([place]) 03/07/2018   LBBB (left bundle branch block) 04/25/2018   QRSD 180 msec   Myocardial infarction [facility])    Past Surgical History:  Procedure Laterality Date   ABDOMINAL AORTIC ANEURYSM REPAIR     BIV PACEMAKER INSERTION CRT-P N/A 07/26/2020   Procedure: BIV PACEMAKER INSERTION CRT-P;  Surgeon: Constance Haw, MD;  Location: [place] [facility];  Service: Cardiovascular;  Laterality: N/A;   EVAR     RENAL ARTERY STENT     RIGHT/LEFT HEART CATH AND CORONARY ANGIOGRAPHY N/A 05/16/2020   Procedure: RIGHT/LEFT HEART CATH AND CORONARY ANGIOGRAPHY;  Surgeon: Leonie Man, MD;  Location: [place] [facility];  Service: Cardiovascular;  Laterality: N/A;     Current Outpatient Medications  Medication Sig Dispense Refill   amiodarone (PACERONE) 200 MG tablet Take 1 tablet (200 mg total) by mouth daily. 90 tablet 1   apixaban (ELIQUIS) 2.5 MG TABS tablet Take 1 tablet (2.5 mg total) by mouth 2 (two) times daily. 180 tablet 0   aspirin EC 81 MG tablet Take 81 mg by mouth daily.     atorvastatin (LIPITOR) 40 MG tablet Take 1 tablet (40 mg total) by mouth daily. 90 tablet 0   carvedilol (COREG) 3.125 MG tablet TAKE 1 TABLET (3.125 MG TOTAL) BY MOUTH 2 (TWO) TIMES DAILY WITH A MEAL. 180 tablet 3   fluticasone (FLONASE) 50 MCG/ACT nasal spray Place 1 spray into both  nostrils daily as needed for allergies.     gabapentin (NEURONTIN) 300 MG capsule Take 300 mg by mouth at bedtime as needed (muscle spasm in back).     omeprazole (PRILOSEC) 20 MG capsule Take 20 mg by mouth daily.     sacubitril-valsartan (ENTRESTO) 97-103 MG Take 1 tablet by mouth 2 (two) times daily. 180 tablet 3   torsemide (DEMADEX) 20 MG tablet TAKE 2 TABLETS BY MOUTH DAILY 180 tablet 1   No current  facility-administered medications for this visit.    Allergies:   Farxiga [dapagliflozin], Tetanus toxoids, and Lisinopril   Social History:  The patient  reports that he quit smoking about 13 years ago. His smoking use included cigarettes. He has never used smokeless tobacco. He reports current alcohol use. He reports that he does not use drugs.   Family History:  The patient's family history includes Diabetes in his mother; Heart disease in his mother; Hypertension in his mother; Varicose Veins in his mother.   ROS:  Please see the history of present illness.   Otherwise, review of systems is positive for none.   All other systems are reviewed and negative.   PHYSICAL EXAM: VS:  BP 96/60   Pulse 60   Ht 6' (1.829 m)   Wt 209 lb 12.8 oz (95.2 kg)   SpO2 94%   BMI 28.45 kg/m  , BMI Body mass index is 28.45 kg/m. GEN: Well nourished, well developed, in no acute distress  HEENT: normal  Neck: no JVD, carotid bruits, or masses Cardiac: RRR; no murmurs, rubs, or gallops,no edema  Respiratory:  clear to auscultation bilaterally, normal work of breathing GI: soft, nontender, nondistended, + BS MS: no deformity or atrophy  Skin: warm and dry, device site well healed Neuro:  Strength and sensation are intact Psych: euthymic mood, full affect  EKG:  EKG is ordered today. Personal review of the ekg ordered shows AV paced  Personal review of the device interrogation today. Results in [place]: 05/11/2020: Magnesium 2.1; NT-Pro BNP 2,980 07/11/2020: Hemoglobin 14.9; Platelets 215 10/12/2020: ALT 11; BUN 21; Creatinine, Ser 1.66; Potassium 4.7; Sodium 139; TSH 6.350    Lipid Panel     Component Value Date/Time   CHOL 174 10/12/2020 0935   TRIG 93 10/12/2020 0935   HDL 77 10/12/2020 0935   CHOLHDL 2.3 10/12/2020 0935   LDLCALC 80 10/12/2020 0935     Wt Readings from Last 3 Encounters:  10/31/20 209 lb 12.8 oz (95.2 kg)  10/12/20 212 lb 9.6 oz (96.4 kg)  07/26/20  203 lb (92.1 kg)      Other studies Reviewed: Additional studies/ records that were reviewed today include: TTE 03/13/2018 Review of the above records today demonstrates:   1. The left ventricle has severely reduced systolic function, with an ejection fraction of 20-25%. The cavity size was normal. Left ventricular diastolic Doppler parameters are consistent with pseudonormalization.  2. The right ventricle has normal systolic function. The cavity was normal. There is no increase in right ventricular wall thickness.  3. Left atrial size was mildly dilated.  4. The mitral valve is normal in structure. Mitral valve regurgitation is mild to moderate by color flow Doppler.  5. The tricuspid valve is normal in structure.  6. The aortic valve is tricuspid Mild calcification of the aortic valve.  7. The pulmonic valve was normal in structure.  8. Severe akinesis of the left ventricular septal.  Left heart catheterization 5-22 Ost LM  to Mid LM lesion is 30% stenosed. Mid LM to Dist LM lesion is 55% stenosed. Prox Cx lesion is 30% stenosed. Ost RCA lesion is 55% stenosed. ------------------------------------------------- LV end diastolic pressure is moderately elevated. Hemodynamic findings consistent with moderate pulmonary hypertension.  Cardiac monitor 06/22/2020 personally reviewed Predominant rhythm was sinus rhythm Up to 25% burden of atrial fibrillation and atrial flutter Rare ventricular ectopy Pauses of up to 3.8 seconds during atrial fibrillation/flutter  ASSESSMENT AND PLAN:  1.  Chronic systolic heart failure due to ischemic cardiomyopathy: NYHA class II symptoms.  Currently on Entresto and beta-blockers.  Was previously having shortness of breath and fatigue.  Is now status post [place] CRT-P implanted 07/26/2020.  LV threshold is elevated.  He has been switched to LV one to can.  No other changes.  2.  Coronary artery disease: No current chest pain.  Plan per primary  cardiology.  3.  Hyperlipidemia: Continue statin per primary cardiology  4.  Atrial flutter: Appears atypical in nature.  Currently on Eliquis 5 mg twice daily and amiodarone 200 mg daily.  High risk medication monitoring.  He has fortunately remained in sinus rhythm.  No changes at this time.   Current medicines are reviewed at length with the patient today.   The patient does not have concerns regarding his medicines.  The following changes were made today: None  Labs/ tests ordered today include:  Orders Placed This Encounter  Procedures   EKG 12-Lead       Disposition:   FU with Myishia Kasik 9 months   Signed, Keirstyn Aydt Meredith Leeds, MD  10/31/2020 9:19 AM     [address] [place] [address] [phone] (office) [phone] (fax)

## 2020-10-31 ENCOUNTER — Ambulatory Visit (INDEPENDENT_AMBULATORY_CARE_PROVIDER_SITE_OTHER): Payer: Medicare Other | Admitting: Cardiology

## 2020-10-31 ENCOUNTER — Other Ambulatory Visit: Payer: Self-pay

## 2020-10-31 ENCOUNTER — Encounter: Payer: Self-pay | Admitting: Cardiology

## 2020-10-31 VITALS — BP 96/60 | HR 60 | Ht 72.0 in | Wt 209.8 lb

## 2020-10-31 DIAGNOSIS — I255 Ischemic cardiomyopathy: Secondary | ICD-10-CM

## 2020-11-03 NOTE — Progress Notes (Signed)
Remote pacemaker transmission.   

## 2020-11-08 ENCOUNTER — Other Ambulatory Visit: Payer: Self-pay | Admitting: Cardiology

## 2020-11-25 DIAGNOSIS — I480 Paroxysmal atrial fibrillation: Secondary | ICD-10-CM | POA: Diagnosis not present

## 2020-11-25 DIAGNOSIS — I5022 Chronic systolic (congestive) heart failure: Secondary | ICD-10-CM | POA: Diagnosis not present

## 2020-11-25 DIAGNOSIS — Z1331 Encounter for screening for depression: Secondary | ICD-10-CM | POA: Diagnosis not present

## 2020-11-25 DIAGNOSIS — R052 Subacute cough: Secondary | ICD-10-CM | POA: Diagnosis not present

## 2020-11-25 DIAGNOSIS — E782 Mixed hyperlipidemia: Secondary | ICD-10-CM | POA: Diagnosis not present

## 2020-12-08 ENCOUNTER — Other Ambulatory Visit: Payer: Self-pay | Admitting: Cardiology

## 2020-12-12 ENCOUNTER — Telehealth: Payer: Self-pay | Admitting: Cardiology

## 2020-12-12 NOTE — Telephone Encounter (Signed)
Pt c/o medication issue:  1. Name of Medication: sacubitril-valsartan (ENTRESTO) 97-103 MG  2. How are you currently taking this medication (dosage and times per day)? Patient has not taken since 11/26/20  3. Are you having a reaction (difficulty breathing--STAT)?   4. What is your medication issue? Dry cough  Dr. Reesa Chew , the patient's PCP started the patient on this medication. The patient developed a dry cough when he went to two pills daily instead of one. The PCP looked up the side effects of entresto and a side effect is a dry cough.  The patient's wife wanted to know if there is another medication the patient can be on. Please advise

## 2020-12-12 NOTE — Telephone Encounter (Signed)
Prescription refill request for Eliquis received. Indication:Aflutter Last office visit:10/22 Scr:1.6 Age: 80 Weight:95.2 kg  Prescription refilled

## 2020-12-12 NOTE — Telephone Encounter (Signed)
Upon your return please address.

## 2020-12-13 NOTE — Telephone Encounter (Signed)
Spoke to the patient just now and let him know Dr. Joya Gaskins recommendations. He tells me that he was told by his PCP to quit taking this medication for two weeks to see if this helped his cough. He says that since he stopped taking it his cough has almost completely stopped.   He wants to know if he could take the medication just once daily instead of twice daily since the cough only developed when he was taking it twice daily.   I will route to Dr. [name] to advise.

## 2020-12-13 NOTE — Telephone Encounter (Signed)
Spoke to the patient just now and let him know Dr. Munley's recommendations. He verbalizes understanding.  

## 2021-01-25 ENCOUNTER — Ambulatory Visit (INDEPENDENT_AMBULATORY_CARE_PROVIDER_SITE_OTHER): Payer: Medicare Other

## 2021-01-25 DIAGNOSIS — I255 Ischemic cardiomyopathy: Secondary | ICD-10-CM

## 2021-01-25 LAB — CUP PACEART REMOTE DEVICE CHECK
Battery Remaining Longevity: 84 mo
Battery Remaining Percentage: 94 %
Battery Voltage: 3.02 V
Brady Statistic AP VP Percent: 62 %
Brady Statistic AP VS Percent: 1 %
Brady Statistic AS VP Percent: 38 %
Brady Statistic AS VS Percent: 1 %
Brady Statistic RA Percent Paced: 60 %
Date Time Interrogation Session: 20230111045552
Implantable Lead Implant Date: 20220712
Implantable Lead Implant Date: 20220712
Implantable Lead Implant Date: 20220712
Implantable Lead Location: 753858
Implantable Lead Location: 753859
Implantable Lead Location: 753860
Implantable Pulse Generator Implant Date: 20220712
Lead Channel Impedance Value: 440 Ohm
Lead Channel Impedance Value: 630 Ohm
Lead Channel Impedance Value: 640 Ohm
Lead Channel Pacing Threshold Amplitude: 0.75 V
Lead Channel Pacing Threshold Amplitude: 0.75 V
Lead Channel Pacing Threshold Amplitude: 1.75 V
Lead Channel Pacing Threshold Pulse Width: 0.5 ms
Lead Channel Pacing Threshold Pulse Width: 0.5 ms
Lead Channel Pacing Threshold Pulse Width: 1 ms
Lead Channel Sensing Intrinsic Amplitude: 12 mV
Lead Channel Sensing Intrinsic Amplitude: 5 mV
Lead Channel Setting Pacing Amplitude: 2 V
Lead Channel Setting Pacing Amplitude: 2.5 V
Lead Channel Setting Pacing Amplitude: 2.5 V
Lead Channel Setting Pacing Pulse Width: 0.5 ms
Lead Channel Setting Pacing Pulse Width: 0.5 ms
Lead Channel Setting Sensing Sensitivity: 2 mV
Pulse Gen Model: 3562
Pulse Gen Serial Number: 3933180

## 2021-02-07 NOTE — Progress Notes (Signed)
Remote pacemaker transmission.   

## 2021-02-13 NOTE — Progress Notes (Signed)
Cardiology Office Note:    Date:  02/14/2021   ID:  Glenn Sullivan, DOB 03-03-1940, MRN [id number]  PCP:  Garwin Brothers, MD  Cardiologist:  Shirlee More, MD    Referring MD: Garwin Brothers, MD    ASSESSMENT:    1. Chronic systolic (congestive) heart failure ([place])   2. SOB (shortness of breath)   3. Status post biventricular pacemaker    PLAN:    In order of problems listed above:  Clearly heart failure is decompensated his fluid overload New York Heart Association class II multiple etiologies including noncompliance pain himself at home at times missing diuretic interruption of Entresto and isolated RV pacing.  Will increase his diuretic and come back Friday recheck labs his wife will bring weights if not responding will need metolazone if not responding may need admission to the hospital.  If his heart failure remains worsening we may need to consider revision of his LV lead.   Next appointment: 6 weeks    Medication Adjustments/Labs and Tests Ordered: Current medicines are reviewed at length with the patient today.  Concerns regarding medicines are outlined above.  Orders Placed This Encounter  Procedures   Basic metabolic panel   Pro b natriuretic peptide (BNP)   EKG 12-Lead   Meds ordered this encounter  Medications   nitroGLYCERIN (NITROSTAT) 0.4 MG SL tablet    Sig: Place 1 tablet (0.4 mg total) under the tongue every 5 (five) minutes as needed.    Dispense:  30 tablet    Refill:  3   torsemide (DEMADEX) 20 MG tablet    Sig: Take 1 tablet (20 mg total) by mouth 2 (two) times daily.    Dispense:  180 tablet    Refill:  3    Chief Complaint  Patient presents with   Shortness of Breath    Ongoing for a month    History of Present Illness:    Glenn Sullivan is a 81 y.o. male with a hx of CAD heart failure with reduced ejection fraction hypertensive heart disease left bundle branch block hyperlipidemia and peripheral arterial disease. He underwent coronary angiography  showing heavily calcified left main diffuse disease with 50% distal stenosis just prior to the trifurcation and mild diffuse disease in the left coronary artery and left main and calcified ostial LAD 50 to 60% stenosis.  He was seen by cardiothoracic surgery but adamantly declined surgical intervention for his multivessel CAD.  He was in atrial fibrillation with scheduled for outpatient cardioversion and spontaneously converted to sinus rhythm.  He subsequent has been seen by EP and had CRT?P 07/26/2020.  His most recent echocardiogram 04/23/2019 showed EF of 20 to 25% with severe dilation severe left atrial enlargement mild mitral regurgitation and dilation of ascending aorta 41 mm.  He waslast seen 10/12/2020.  Compliance with diet, lifestyle and medications: Yes  His weight has gone up 10 to 12 pounds he has been taking gabapentin that can cause intense sodium retention he feels more fatigued and short of breath. No chest pain palpitation or syncope. His wife is present participates in evaluation and decision making He is having a big problem with urinary frequency and nocturia discussed with his PCP but no intervention and at times has missed his second dose of diuretic because of this and to mitigate symptoms I am getting him on low-dose of Hytrin has been intensify therapy for heart failure  Symptoms worsen when he was taken off Entresto because of cough he is  back on a once daily and also when he was reprogrammed RV pacing. Past Medical History:  Diagnosis Date   AAA (abdominal aortic aneurysm) without rupture 12/24/2013   Barrett's esophagus    Cardiomyopathy ([place]) 03/07/2018   EF 35% in 2017   Chronic combined systolic and diastolic CHF (congestive heart failure) ([place]) 04/25/2018   Coronary artery disease 03/07/2018   Left heart cath 2017:Conclusions Peripheral Procedure Description Patent stent graft in abdominal aorta Patent Renal artery stents Diagnostic Procedure Summary Diffuse Mild to  Moderate calcific coronary artery disease. IFR in the RCA was 1.0 after hyperemic response with adenosine. Moderate global LV systolic dysfunction. LV ejection fraction is 35% Diagnostic Procedure Recommendations Medical the   Diarrhea 04/28/2018   Dyslipidemia 10/28/2015   H/O aortic aneurysm repair 10/28/2015   Hypertensive heart disease with heart failure ([place]) 03/07/2018   LBBB (left bundle branch block) 04/25/2018   QRSD 180 msec   Myocardial infarction [facility])     Past Surgical History:  Procedure Laterality Date   ABDOMINAL AORTIC ANEURYSM REPAIR     BIV PACEMAKER INSERTION CRT-P N/A 07/26/2020   Procedure: BIV PACEMAKER INSERTION CRT-P;  Surgeon: Constance Haw, MD;  Location: [place] [facility];  Service: Cardiovascular;  Laterality: N/A;   EVAR     RENAL ARTERY STENT     RIGHT/LEFT HEART CATH AND CORONARY ANGIOGRAPHY N/A 05/16/2020   Procedure: RIGHT/LEFT HEART CATH AND CORONARY ANGIOGRAPHY;  Surgeon: Leonie Man, MD;  Location: [place] [facility];  Service: Cardiovascular;  Laterality: N/A;    Current Medications: Current Meds  Medication Sig   amiodarone (PACERONE) 200 MG tablet TAKE 1 TABLET BY MOUTH EVERY DAY (Patient taking differently: Take 200 mg by mouth daily.)   apixaban (ELIQUIS) 2.5 MG TABS tablet TAKE 1 TABLET BY MOUTH TWICE A DAY (Patient taking differently: Take 2.5 mg by mouth 2 (two) times daily.)   aspirin EC 81 MG tablet Take 81 mg by mouth daily.   atorvastatin (LIPITOR) 40 MG tablet Take 1 tablet (40 mg total) by mouth daily.   carvedilol (COREG) 3.125 MG tablet TAKE 1 TABLET (3.125 MG TOTAL) BY MOUTH 2 (TWO) TIMES DAILY WITH A MEAL.   fluticasone (FLONASE) 50 MCG/ACT nasal spray Place 1 spray into both nostrils daily as needed for allergies.   nitroGLYCERIN (NITROSTAT) 0.4 MG SL tablet Place 1 tablet (0.4 mg total) under the tongue every 5 (five) minutes as needed.   omeprazole (PRILOSEC) 20 MG capsule Take 20 mg by mouth daily.    sacubitril-valsartan (ENTRESTO) 97-103 MG Take 1 tablet by mouth 2 (two) times daily. (Patient taking differently: Take 1 tablet by mouth daily.)   torsemide (DEMADEX) 20 MG tablet Take 1 tablet (20 mg total) by mouth 2 (two) times daily.   [DISCONTINUED] gabapentin (NEURONTIN) 300 MG capsule Take 300 mg by mouth at bedtime as needed (muscle spasm in back).   [DISCONTINUED] torsemide (DEMADEX) 20 MG tablet TAKE 2 TABLETS BY MOUTH DAILY (Patient taking differently: Take 40 mg by mouth daily.)     Allergies:   Farxiga [dapagliflozin], Tetanus toxoids, and Lisinopril   Social History   Socioeconomic History   Marital status: Married    Spouse name: Not on file   Number of children: Not on file   Years of education: Not on file   Highest education level: Not on file  Occupational History   Not on file  Tobacco Use   Smoking status: Former    Types: Cigarettes  Quit date: 01/16/2007    Years since quitting: 14.0   Smokeless tobacco: Never  Vaping Use   Vaping Use: Never used  Substance and Sexual Activity   Alcohol use: Yes    Comment: occ   Drug use: No   Sexual activity: Not on file  Other Topics Concern   Not on file  Social History Narrative   Not on file   Social Determinants of Health   Financial Resource Strain: Not on file  Food Insecurity: Not on file  Transportation Needs: Not on file  Physical Activity: Not on file  Stress: Not on file  Social Connections: Not on file     Family History: The patient's family history includes Diabetes in his mother; Heart disease in his mother; Hypertension in his mother; Varicose Veins in his mother. ROS:   Please see the history of present illness.    All other systems reviewed and are negative.  EKGs/Labs/Other Studies Reviewed:    The following studies were reviewed today:  EKG:  EKG ordered today and personally reviewed.  The ekg ordered today demonstrates dual-chamber paced rhythm RV pacing profile  Recent  Labs: 05/11/2020: Magnesium 2.1; NT-Pro BNP 2,980 07/11/2020: Hemoglobin 14.9; Platelets 215 10/12/2020: ALT 11; BUN 21; Creatinine, Ser 1.66; Potassium 4.7; Sodium 139; TSH 6.350  Recent Lipid Panel    Component Value Date/Time   CHOL 174 10/12/2020 0935   TRIG 93 10/12/2020 0935   HDL 77 10/12/2020 0935   CHOLHDL 2.3 10/12/2020 0935   LDLCALC 80 10/12/2020 0935    Physical Exam:    VS:  BP (!) 148/70 (BP Location: Left Arm, Patient Position: Sitting)    Pulse 60    Ht 6' (1.829 m)    Wt 214 lb 12.8 oz (97.4 kg)    SpO2 91%    BMI 29.13 kg/m     Wt Readings from Last 3 Encounters:  02/14/21 214 lb 12.8 oz (97.4 kg)  10/31/20 209 lb 12.8 oz (95.2 kg)  10/12/20 212 lb 9.6 oz (96.4 kg)     GEN:  Well nourished, well developed in no acute distress HEENT: Normal NECK: Mild JVD; No carotid bruits LYMPHATICS: No lymphadenopathy CARDIAC: Soft S1 soft S2 rate RRR, no murmurs, rubs, gallops RESPIRATORY:  Clear to auscultation without rales, wheezing or rhonchi  ABDOMEN: Soft, non-tender, non-distended MUSCULOSKELETAL: 4+ ankle to knee pitting edema; No deformity  SKIN: Warm and dry NEUROLOGIC:  Alert and oriented x 3 PSYCHIATRIC:  Normal affect    Signed, Shirlee More, MD  02/14/2021 10:44 AM    [place]

## 2021-02-14 ENCOUNTER — Other Ambulatory Visit: Payer: Self-pay

## 2021-02-14 ENCOUNTER — Ambulatory Visit (INDEPENDENT_AMBULATORY_CARE_PROVIDER_SITE_OTHER): Payer: Medicare Other | Admitting: Cardiology

## 2021-02-14 ENCOUNTER — Encounter: Payer: Self-pay | Admitting: Cardiology

## 2021-02-14 VITALS — BP 148/70 | HR 60 | Ht 72.0 in | Wt 214.8 lb

## 2021-02-14 DIAGNOSIS — Z950 Presence of cardiac pacemaker: Secondary | ICD-10-CM | POA: Diagnosis not present

## 2021-02-14 DIAGNOSIS — R0602 Shortness of breath: Secondary | ICD-10-CM | POA: Diagnosis not present

## 2021-02-14 DIAGNOSIS — I5022 Chronic systolic (congestive) heart failure: Secondary | ICD-10-CM

## 2021-02-14 MED ORDER — NITROGLYCERIN 0.4 MG SL SUBL: 0.4000 mg | SUBLINGUAL_TABLET | SUBLINGUAL | 3 refills | Status: AC | PRN

## 2021-02-14 MED ORDER — TORSEMIDE 20 MG PO TABS: 20.0000 mg | ORAL_TABLET | Freq: Two times a day (BID) | ORAL | 3 refills | Status: DC

## 2021-02-14 NOTE — Patient Instructions (Signed)
Medication Instructions:  Your physician has recommended you make the following change in your medication:  START: Torsemide 20 mg take three times daily until your weight is down to 205 lbs then take twice daily.  START: Nitroglycerin 0.4 mg take one tablet by mouth every 5 minutes up three times as needed for chest pain.  STOP: Gabapentin.  *If you need a refill on your cardiac medications before your next appointment, please call your pharmacy*   Lab Work: Your physician recommends that you return for lab work in: [place] If you have labs (blood work) drawn today and your tests are completely normal, you will receive your results only by: [place] (if you have [place]) OR A paper copy in the mail If you have any lab test that is abnormal or we need to change your treatment, we will call you to review the results.   Testing/Procedures: None   Follow-Up: At [facility], you and your health needs are our priority.  As part of our continuing mission to provide you with exceptional heart care, we have created designated Provider Care Teams.  These Care Teams include your primary Cardiologist (physician) and Advanced Practice Providers (APPs -  Physician Assistants and Nurse Practitioners) who all work together to provide you with the care you need, when you need it.  We recommend signing up for the patient portal called "MyChart".  Sign up information is provided on this After Visit Summary.  MyChart is used to connect with patients for Virtual Visits (Telemedicine).  Patients are able to view lab/test results, encounter notes, upcoming appointments, etc.  Non-urgent messages can be sent to your provider as well.   To learn more about what you can do with MyChart, go to [email or web address].    Your next appointment:   6 week(s)  The format for your next appointment:   In Person  Provider:   Shirlee More, MD    Other Instructions

## 2021-02-16 ENCOUNTER — Telehealth: Payer: Self-pay

## 2021-02-16 LAB — BASIC METABOLIC PANEL
BUN/Creatinine Ratio: 19 (ref 10–24)
BUN: 28 mg/dL — ABNORMAL HIGH (ref 8–27)
CO2: 25 mmol/L (ref 20–29)
Calcium: 8.7 mg/dL (ref 8.6–10.2)
Chloride: 99 mmol/L (ref 96–106)
Creatinine, Ser: 1.44 mg/dL — ABNORMAL HIGH (ref 0.76–1.27)
Glucose: 96 mg/dL (ref 70–99)
Potassium: 4.1 mmol/L (ref 3.5–5.2)
Sodium: 138 mmol/L (ref 134–144)
eGFR: 49 mL/min/{1.73_m2} — ABNORMAL LOW (ref >59)

## 2021-02-16 LAB — PRO B NATRIURETIC PEPTIDE: NT-Pro BNP: 1291 pg/mL — ABNORMAL HIGH (ref 0–486)

## 2021-02-16 NOTE — Telephone Encounter (Signed)
Spoke with patient regarding results and recommendation.  Patient verbalizes understanding and is agreeable to plan of care. Advised patient to call back with any issues or concerns.  

## 2021-02-16 NOTE — Telephone Encounter (Signed)
-----   Message from Richardo Priest, MD sent at 02/16/2021 11:30 AM EST ----- Normal or stable result  No changes

## 2021-02-27 DIAGNOSIS — I5022 Chronic systolic (congestive) heart failure: Secondary | ICD-10-CM | POA: Diagnosis not present

## 2021-02-27 DIAGNOSIS — R052 Subacute cough: Secondary | ICD-10-CM | POA: Diagnosis not present

## 2021-02-27 DIAGNOSIS — I480 Paroxysmal atrial fibrillation: Secondary | ICD-10-CM | POA: Diagnosis not present

## 2021-02-27 DIAGNOSIS — E782 Mixed hyperlipidemia: Secondary | ICD-10-CM | POA: Diagnosis not present

## 2021-04-08 NOTE — Progress Notes (Deleted)
?Cardiology Office Note:   ? ?Date:  04/08/2021  ? ?ID:  Glenn Sullivan, DOB 1940/10/22, MRN [id number] ? ?PCP:  Garwin Brothers, MD  ?Cardiologist:  Shirlee More, MD   ? ?Referring MD: Garwin Brothers, MD  ? ? ?ASSESSMENT:   ? ?No diagnosis found. ?PLAN:   ? ?In order of problems listed above: ? ?*** ? ? ?Next appointment: *** ? ? ?Medication Adjustments/Labs and Tests Ordered: ?Current medicines are reviewed at length with the patient today.  Concerns regarding medicines are outlined above.  ?No orders of the defined types were placed in this encounter. ? ?No orders of the defined types were placed in this encounter. ? ? ?No chief complaint on file. ? ? ?History of Present Illness:   ? ?Glenn Sullivan is a 80 y.o. male with a hx of CAD heart failure with reduced ejection fraction hypertensive heart disease left bundle branch block hyperlipidemia and peripheral arterial disease. He underwent coronary angiography showing heavily calcified left main diffuse disease with 50% distal stenosis just prior to the trifurcation and mild diffuse disease in the left coronary artery and left main and calcified ostial LAD 50 to 60% stenosis.  He was seen by cardiothoracic surgery but adamantly declined surgical intervention for his multivessel CAD.  He was in atrial fibrillation with scheduled for outpatient cardioversion and spontaneously converted to sinus rhythm.  He subsequent has been seen by EP and had CRT-P 07/26/2020.  His most recent echocardiogram 04/23/2019 showed EF of 20 to 25% with severe dilation severe left atrial enlargement mild mitral regurgitation and dilation of ascending aorta 41 mm last seen 02/14/2021. ?Compliance with diet, lifestyle and medications: *** ?Past Medical History:  ?Diagnosis Date  ? AAA (abdominal aortic aneurysm) without rupture 12/24/2013  ? Barrett's esophagus   ? Cardiomyopathy ([place]) 03/07/2018  ? EF 35% in 2017  ? Chronic combined systolic and diastolic CHF (congestive heart failure) ([place])  04/25/2018  ? Coronary artery disease 03/07/2018  ? Left heart cath 2017:Conclusions Peripheral Procedure Description Patent stent graft in abdominal aorta Patent Renal artery stents Diagnostic Procedure Summary Diffuse Mild to Moderate calcific coronary artery disease. IFR in the RCA was 1.0 after hyperemic response with adenosine. Moderate global LV systolic dysfunction. LV ejection fraction is 35% Diagnostic Procedure Recommendations Medical the  ? Diarrhea 04/28/2018  ? Dyslipidemia 10/28/2015  ? H/O aortic aneurysm repair 10/28/2015  ? Hypertensive heart disease with heart failure ([place]) 03/07/2018  ? LBBB (left bundle branch block) 04/25/2018  ? QRSD 180 msec  ? Myocardial infarction [facility])   ? ? ?Past Surgical History:  ?Procedure Laterality Date  ? ABDOMINAL AORTIC ANEURYSM REPAIR    ? BIV PACEMAKER INSERTION CRT-P N/A 07/26/2020  ? Procedure: BIV PACEMAKER INSERTION CRT-P;  Surgeon: Constance Haw, MD;  Location: [place] [facility];  Service: Cardiovascular;  Laterality: N/A;  ? EVAR    ? RENAL ARTERY STENT    ? RIGHT/LEFT HEART CATH AND CORONARY ANGIOGRAPHY N/A 05/16/2020  ? Procedure: RIGHT/LEFT HEART CATH AND CORONARY ANGIOGRAPHY;  Surgeon: Leonie Man, MD;  Location: [place] [facility];  Service: Cardiovascular;  Laterality: N/A;  ? ? ?Current Medications: ?No outpatient medications have been marked as taking for the 04/17/21 encounter (Appointment) with Richardo Priest, MD.  ?  ? ?Allergies:   Farxiga [dapagliflozin], Tetanus toxoids, and Lisinopril  ? ?Social History  ? ?Socioeconomic History  ? Marital status: Married  ?  Spouse name: Not on file  ? Number of children: Not  on file  ? Years of education: Not on file  ? Highest education level: Not on file  ?Occupational History  ? Not on file  ?Tobacco Use  ? Smoking status: Former  ?  Types: Cigarettes  ?  Quit date: 01/16/2007  ?  Years since quitting: 14.2  ? Smokeless tobacco: Never  ?Vaping Use  ? Vaping Use: Never used  ?Substance and Sexual  Activity  ? Alcohol use: Yes  ?  Comment: occ  ? Drug use: No  ? Sexual activity: Not on file  ?Other Topics Concern  ? Not on file  ?Social History Narrative  ? Not on file  ? ?Social Determinants of Health  ? ?Financial Resource Strain: Not on file  ?Food Insecurity: Not on file  ?Transportation Needs: Not on file  ?Physical Activity: Not on file  ?Stress: Not on file  ?Social Connections: Not on file  ?  ? ?Family History: ?The patient's ***family history includes Diabetes in his mother; Heart disease in his mother; Hypertension in his mother; Varicose Veins in his mother. ?ROS:   ?Please see the history of present illness.    ?All other systems reviewed and are negative. ? ?EKGs/Labs/Other Studies Reviewed:   ? ?The following studies were reviewed today: ? ?EKG:  EKG ordered today and personally reviewed.  The ekg ordered today demonstrates *** ? ?Recent Labs: ?05/11/2020: Magnesium 2.1 ?07/11/2020: Hemoglobin 14.9; Platelets 215 ?10/12/2020: ALT 11; TSH 6.350 ?02/14/2021: BUN 28; Creatinine, Ser 1.44; NT-Pro BNP 1,291; Potassium 4.1; Sodium 138  ?Recent Lipid Panel ?   ?Component Value Date/Time  ? CHOL 174 10/12/2020 0935  ? TRIG 93 10/12/2020 0935  ? HDL 77 10/12/2020 0935  ? CHOLHDL 2.3 10/12/2020 0935  ? [place] 80 10/12/2020 0935  ? ? ?Physical Exam:   ? ?VS:  There were no vitals taken for this visit.   ? ?Wt Readings from Last 3 Encounters:  ?02/14/21 214 lb 12.8 oz (97.4 kg)  ?10/31/20 209 lb 12.8 oz (95.2 kg)  ?10/12/20 212 lb 9.6 oz (96.4 kg)  ?  ? ?GEN: *** Well nourished, well developed in no acute distress ?HEENT: Normal ?NECK: No JVD; No carotid bruits ?LYMPHATICS: No lymphadenopathy ?CARDIAC: ***RRR, no murmurs, rubs, gallops ?RESPIRATORY:  Clear to auscultation without rales, wheezing or rhonchi  ?ABDOMEN: Soft, non-tender, non-distended ?MUSCULOSKELETAL:  No edema; No deformity  ?SKIN: Warm and dry ?NEUROLOGIC:  Alert and oriented x 3 ?PSYCHIATRIC:  Normal affect  ? ? ?Signed, ?Shirlee More, MD   ?04/08/2021 10:59 AM    ?[place]  ?

## 2021-04-14 ENCOUNTER — Other Ambulatory Visit: Payer: Self-pay | Admitting: Cardiology

## 2021-04-14 NOTE — Telephone Encounter (Signed)
Prescription refill request for Eliquis received. ?Indication:Afib ?Last office visit:1/23 ?Scr:1.4 ?Age: 81 ?Weight:97.4 kg ? ?Prescription refilled ? ?

## 2021-04-17 ENCOUNTER — Ambulatory Visit (INDEPENDENT_AMBULATORY_CARE_PROVIDER_SITE_OTHER): Payer: Medicare Other | Admitting: Cardiology

## 2021-04-17 ENCOUNTER — Encounter: Payer: Self-pay | Admitting: Cardiology

## 2021-04-17 VITALS — BP 132/80 | HR 60 | Ht 72.0 in | Wt 213.0 lb

## 2021-04-17 DIAGNOSIS — Z950 Presence of cardiac pacemaker: Secondary | ICD-10-CM

## 2021-04-17 DIAGNOSIS — I255 Ischemic cardiomyopathy: Secondary | ICD-10-CM

## 2021-04-17 DIAGNOSIS — Z79899 Other long term (current) drug therapy: Secondary | ICD-10-CM

## 2021-04-17 DIAGNOSIS — E785 Hyperlipidemia, unspecified: Secondary | ICD-10-CM

## 2021-04-17 DIAGNOSIS — I5022 Chronic systolic (congestive) heart failure: Secondary | ICD-10-CM | POA: Diagnosis not present

## 2021-04-17 DIAGNOSIS — Z7901 Long term (current) use of anticoagulants: Secondary | ICD-10-CM | POA: Diagnosis not present

## 2021-04-17 DIAGNOSIS — I25118 Atherosclerotic heart disease of native coronary artery with other forms of angina pectoris: Secondary | ICD-10-CM | POA: Diagnosis not present

## 2021-04-17 DIAGNOSIS — I480 Paroxysmal atrial fibrillation: Secondary | ICD-10-CM | POA: Diagnosis not present

## 2021-04-17 DIAGNOSIS — R7989 Other specified abnormal findings of blood chemistry: Secondary | ICD-10-CM

## 2021-04-17 NOTE — Progress Notes (Signed)
?Cardiology Office Note:   ? ?Date:  04/17/2021  ? ?ID:  Glenn Sullivan, DOB 1940-09-15, MRN [id number] ? ?PCP:  Glenn Brothers, MD  ?Cardiologist:  Glenn More, MD   ? ?Referring MD: Glenn Brothers, MD  ? ? ?ASSESSMENT:   ? ?1. Chronic systolic (congestive) heart failure (HCC)   ?2. Cardiomyopathy, ischemic   ?3. Status post biventricular pacemaker   ?4. Paroxysmal atrial fibrillation (HCC)   ?5. On amiodarone therapy   ?6. Chronic anticoagulation   ?7. Coronary artery disease of native artery of native heart with stable angina pectoris ([place])   ?8. Dyslipidemia   ? ?PLAN:   ? ?In order of problems listed above: ? ?Overall he is done well heart failure is compensated continue his current loop diuretic and Entresto he is off to take it once a day along with his beta-blocker. ?Stable device function followed in our clinic ?Maintain sinus rhythm continue low-dose amiodarone check liver thyroid studies continue his anticoagulant reduced dose with age elevated creatinine ?Stable CAD he has deferred revascularization having ?Glenn Sullivan continues medical therapy ?Continue his statin we will check a lipid profile ? ? ?Next appointment: 6 months ? ? ?Medication Adjustments/Labs and Tests Ordered: ?Current medicines are reviewed at length with the patient today.  Concerns regarding medicines are outlined above.  ?No orders of the defined types were placed in this encounter. ? ?No orders of the defined types were placed in this encounter. ? ? ?Chief Complaint  ?Patient presents with  ? Follow-up  ? Coronary Artery Disease  ? Congestive Heart Failure  ? ? ?History of Present Illness:   ? ?Glenn Sullivan is a 81 y.o. male with a hx of CAD heart failure with reduced ejection fraction hypertensive heart disease left bundle branch block hyperlipidemia and peripheral arterial disease. He underwent coronary angiography showing heavily calcified left main diffuse disease with 50% distal stenosis just prior to the trifurcation and mild diffuse disease  in the left coronary artery and left main and calcified ostial LAD 50 to 60% stenosis.  He was seen by cardiothoracic surgery but adamantly declined surgical intervention for his multivessel CAD.  He was in atrial fibrillation with scheduled for outpatient cardioversion and spontaneously converted to sinus rhythm.  He subsequent has been seen by EP and had CRT-P 07/26/2020.  His most recent echocardiogram 04/23/2019 showed EF of 20 to 25% with severe dilation severe left atrial enlargement mild mitral regurgitation and dilation of ascending aorta 41 mm last seen 02/14/2021. ? ?Compliance with diet, lifestyle and medications: Yes ? ?Overall is doing well he has had no angina or nitroglycerin usage ?His endurance is diminished compared to previous however he is not having shortness of breath edema chest pain palpitation or syncope ?EKG shows 100% dual paced rhythm ?Tolerates statin without muscle pain or weakness ?Tolerates his anticoagulant without bleeding complication ?Most recent lipid profile 10/12/2020 cholesterol 174 LDL 80 creatinine 02/14/2021 1.44 ?Past Medical History:  ?Diagnosis Date  ? AAA (abdominal aortic aneurysm) without rupture ([place]) 12/24/2013  ? Barrett's esophagus   ? Cardiomyopathy ([place]) 03/07/2018  ? EF 35% in 2017  ? Chronic combined systolic and diastolic CHF (congestive heart failure) ([place]) 04/25/2018  ? Coronary artery disease 03/07/2018  ? Left heart cath 2017:Conclusions Peripheral Procedure Description Patent stent graft in abdominal aorta Patent Renal artery stents Diagnostic Procedure Summary Diffuse Mild to Moderate calcific coronary artery disease. IFR in the RCA was 1.0 after hyperemic response with adenosine. Moderate global LV systolic dysfunction. LV ejection  fraction is 35% Diagnostic Procedure Recommendations Medical the  ? Diarrhea 04/28/2018  ? Dyslipidemia 10/28/2015  ? H/O aortic aneurysm repair 10/28/2015  ? Hypertensive heart disease with heart failure ([place]) 03/07/2018  ?  LBBB (left bundle branch block) 04/25/2018  ? QRSD 180 msec  ? Myocardial infarction [facility])   ? ? ?Past Surgical History:  ?Procedure Laterality Date  ? ABDOMINAL AORTIC ANEURYSM REPAIR    ? BIV PACEMAKER INSERTION CRT-P N/A 07/26/2020  ? Procedure: BIV PACEMAKER INSERTION CRT-P;  Surgeon: Constance Haw, MD;  Location: [place] [facility];  Service: Cardiovascular;  Laterality: N/A;  ? EVAR    ? RENAL ARTERY STENT    ? RIGHT/LEFT HEART CATH AND CORONARY ANGIOGRAPHY N/A 05/16/2020  ? Procedure: RIGHT/LEFT HEART CATH AND CORONARY ANGIOGRAPHY;  Surgeon: Leonie Man, MD;  Location: [place] [facility];  Service: Cardiovascular;  Laterality: N/A;  ? ? ?Current Medications: ?Current Meds  ?Medication Sig  ? amiodarone (PACERONE) 200 MG tablet TAKE 1 TABLET BY MOUTH EVERY DAY (Patient taking differently: Take 200 mg by mouth daily.)  ? aspirin EC 81 MG tablet Take 81 mg by mouth daily.  ? atorvastatin (LIPITOR) 40 MG tablet Take 1 tablet (40 mg total) by mouth daily.  ? carvedilol (COREG) 3.125 MG tablet TAKE 1 TABLET (3.125 MG TOTAL) BY MOUTH 2 (TWO) TIMES DAILY WITH A MEAL.  ? ELIQUIS 2.5 MG TABS tablet TAKE 1 TABLET BY MOUTH TWICE A DAY  ? fluticasone (FLONASE) 50 MCG/ACT nasal spray Place 1 spray into both nostrils daily as needed for allergies.  ? nitroGLYCERIN (NITROSTAT) 0.4 MG SL tablet Place 1 tablet (0.4 mg total) under the tongue every 5 (five) minutes as needed.  ? omeprazole (PRILOSEC) 20 MG capsule Take 20 mg by mouth daily.  ? sacubitril-valsartan (ENTRESTO) 97-103 MG Take 1 tablet by mouth 2 (two) times daily. (Patient taking differently: Take 1 tablet by mouth daily.)  ? torsemide (DEMADEX) 20 MG tablet Take 1 tablet (20 mg total) by mouth 2 (two) times daily. (Patient taking differently: Take 20 mg by mouth in the morning, at noon, and at bedtime.)  ?  ? ?Allergies:   Farxiga [dapagliflozin], Tetanus toxoids, and Lisinopril  ? ?Social History  ? ?Socioeconomic History  ? Marital status: Married  ?   Spouse name: Not on file  ? Number of children: Not on file  ? Years of education: Not on file  ? Highest education level: Not on file  ?Occupational History  ? Not on file  ?Tobacco Use  ? Smoking status: Former  ?  Types: Cigarettes  ?  Quit date: 01/16/2007  ?  Years since quitting: 14.2  ?  Passive exposure: Past  ? Smokeless tobacco: Never  ?Vaping Use  ? Vaping Use: Never used  ?Substance and Sexual Activity  ? Alcohol use: Yes  ?  Comment: occ  ? Drug use: No  ? Sexual activity: Not on file  ?Other Topics Concern  ? Not on file  ?Social History Narrative  ? Not on file  ? ?Social Determinants of Health  ? ?Financial Resource Strain: Not on file  ?Food Insecurity: Not on file  ?Transportation Needs: Not on file  ?Physical Activity: Not on file  ?Stress: Not on file  ?Social Connections: Not on file  ?  ? ?Family History: ?The patient's family history includes Diabetes in his mother; Heart disease in his mother; Hypertension in his mother; Varicose Veins in his mother. ?ROS:   ?Please  see the history of present illness.    ?All other systems reviewed and are negative. ? ?EKGs/Labs/Other Studies Reviewed:   ? ?The following studies were reviewed today: ? ?EKG:  EKG ordered today and personally reviewed.  The ekg ordered today demonstrates dual paced ? ?Recent Labs: ?05/11/2020: Magnesium 2.1 ?07/11/2020: Hemoglobin 14.9; Platelets 215 ?10/12/2020: ALT 11; TSH 6.350 ?02/14/2021: BUN 28; Creatinine, Ser 1.44; NT-Pro BNP 1,291; Potassium 4.1; Sodium 138  ?Recent Lipid Panel ?   ?Component Value Date/Time  ? CHOL 174 10/12/2020 0935  ? TRIG 93 10/12/2020 0935  ? HDL 77 10/12/2020 0935  ? CHOLHDL 2.3 10/12/2020 0935  ? [place] 80 10/12/2020 0935  ? ? ?Physical Exam:   ? ?VS:  BP (!) 152/74 (BP Location: Right Arm)   Pulse 60   Ht 6' (1.829 m)   Wt 213 lb (96.6 kg)   SpO2 99%   BMI 28.89 kg/m?    ? ?Wt Readings from Last 3 Encounters:  ?04/17/21 213 lb (96.6 kg)  ?02/14/21 214 lb 12.8 oz (97.4 kg)  ?10/31/20 209 lb  12.8 oz (95.2 kg)  ?  ? ?GEN:  Well nourished, well developed in no acute distress ?HEENT: Normal ?NECK: No JVD; No carotid bruits ?LYMPHATICS: No lymphadenopathy ?CARDIAC: RRR, no murmurs, rubs, gallops ?RESPIRA

## 2021-04-17 NOTE — Patient Instructions (Signed)
Medication Instructions:  ?Your physician recommends that you continue on your current medications as directed. Please refer to the Current Medication list given to you today. ? ?*If you need a refill on your cardiac medications before your next appointment, please call your pharmacy* ? ? ?Lab Work: ?Your physician recommends that you have labs done in the office today. CMET, Pro BNP, TSH, Free T3, Free T4 and Lipids. ? ?If you have labs (blood work) drawn today and your tests are completely normal, you will receive your results only by: ?MyChart Message (if you have MyChart) OR ?A paper copy in the mail ?If you have any lab test that is abnormal or we need to change your treatment, we will call you to review the results. ? ? ?Testing/Procedures: ?None ordered ? ? ?Follow-Up: ?At [facility], you and your health needs are our priority.  As part of our continuing mission to provide you with exceptional heart care, we have created designated Provider Care Teams.  These Care Teams include your primary Cardiologist (physician) and Advanced Practice Providers (APPs -  Physician Assistants and Nurse Practitioners) who all work together to provide you with the care you need, when you need it. ? ?We recommend signing up for the patient portal called "MyChart".  Sign up information is provided on this After Visit Summary.  MyChart is used to connect with patients for Virtual Visits (Telemedicine).  Patients are able to view lab/test results, encounter notes, upcoming appointments, etc.  Non-urgent messages can be sent to your provider as well.   ?To learn more about what you can do with MyChart, go to [email or web address].   ? ?Your next appointment:   ?6 month(s) ? ?The format for your next appointment:   ?In Person ? ?Provider:   ?Shirlee More, MD ? ? ?Other Instructions ?NA  ?

## 2021-04-18 LAB — TSH: TSH: 7.81 u[IU]/mL — ABNORMAL HIGH (ref 0.450–4.500)

## 2021-04-18 LAB — COMPREHENSIVE METABOLIC PANEL
ALT: 24 IU/L (ref 0–44)
AST: 18 IU/L (ref 0–40)
Albumin/Globulin Ratio: 2 (ref 1.2–2.2)
Albumin: 4.5 g/dL (ref 3.6–4.6)
Alkaline Phosphatase: 92 IU/L (ref 44–121)
BUN/Creatinine Ratio: 21 (ref 10–24)
BUN: 30 mg/dL — ABNORMAL HIGH (ref 8–27)
Bilirubin Total: 0.5 mg/dL (ref 0.0–1.2)
CO2: 26 mmol/L (ref 20–29)
Calcium: 8.4 mg/dL — ABNORMAL LOW (ref 8.6–10.2)
Chloride: 100 mmol/L (ref 96–106)
Creatinine, Ser: 1.42 mg/dL — ABNORMAL HIGH (ref 0.76–1.27)
Globulin, Total: 2.2 g/dL (ref 1.5–4.5)
Glucose: 90 mg/dL (ref 70–99)
Potassium: 4.3 mmol/L (ref 3.5–5.2)
Sodium: 141 mmol/L (ref 134–144)
Total Protein: 6.7 g/dL (ref 6.0–8.5)
eGFR: 50 mL/min/{1.73_m2} — ABNORMAL LOW (ref >59)

## 2021-04-18 LAB — LIPID PANEL
Chol/HDL Ratio: 2.3 ratio (ref 0.0–5.0)
Cholesterol, Total: 180 mg/dL (ref 100–199)
HDL: 78 mg/dL (ref >39)
LDL Chol Calc (NIH): 85 mg/dL (ref 0–99)
Triglycerides: 96 mg/dL (ref 0–149)
VLDL Cholesterol Cal: 17 mg/dL (ref 5–40)

## 2021-04-18 LAB — T4, FREE: Free T4: 1.62 ng/dL (ref 0.82–1.77)

## 2021-04-18 LAB — T3, FREE: T3, Free: 2 pg/mL (ref 2.0–4.4)

## 2021-04-18 LAB — PRO B NATRIURETIC PEPTIDE: NT-Pro BNP: 1170 pg/mL — ABNORMAL HIGH (ref 0–486)

## 2021-04-18 NOTE — Addendum Note (Signed)
Addended by: Truddie Hidden on: 04/18/2021 12:58 PM ? ? Modules accepted: Orders ? ?

## 2021-04-19 ENCOUNTER — Telehealth: Payer: Self-pay

## 2021-04-19 DIAGNOSIS — Z79899 Other long term (current) drug therapy: Secondary | ICD-10-CM

## 2021-04-19 DIAGNOSIS — I5022 Chronic systolic (congestive) heart failure: Secondary | ICD-10-CM

## 2021-04-19 DIAGNOSIS — R7989 Other specified abnormal findings of blood chemistry: Secondary | ICD-10-CM

## 2021-04-19 NOTE — Telephone Encounter (Signed)
Aware of results and recommendation. Lab order on file ?

## 2021-04-19 NOTE — Telephone Encounter (Signed)
-----   Message from Richardo Priest, MD sent at 04/18/2021  8:55 AM EDT ----- ?Regarding: FW: ?Stable results ?Recheck TSH free T3 free T4 BMP 3 mos ?----- Message ----- ?From: Interface, [facility] Lab Results In ?Sent: 04/18/2021   7:38 AM EDT ?To: Richardo Priest, MD ? ?

## 2021-04-26 ENCOUNTER — Ambulatory Visit (INDEPENDENT_AMBULATORY_CARE_PROVIDER_SITE_OTHER): Payer: Medicare Other

## 2021-04-26 DIAGNOSIS — I447 Left bundle-branch block, unspecified: Secondary | ICD-10-CM | POA: Diagnosis not present

## 2021-04-26 LAB — CUP PACEART REMOTE DEVICE CHECK
Battery Remaining Longevity: 81 mo
Battery Remaining Percentage: 91 %
Battery Voltage: 3.02 V
Brady Statistic AP VP Percent: 57 %
Brady Statistic AP VS Percent: 1 %
Brady Statistic AS VP Percent: 42 %
Brady Statistic AS VS Percent: 1 %
Brady Statistic RA Percent Paced: 56 %
Date Time Interrogation Session: 20230412040012
Implantable Lead Implant Date: 20220712
Implantable Lead Implant Date: 20220712
Implantable Lead Implant Date: 20220712
Implantable Lead Location: 753858
Implantable Lead Location: 753859
Implantable Lead Location: 753860
Implantable Pulse Generator Implant Date: 20220712
Lead Channel Impedance Value: 430 Ohm
Lead Channel Impedance Value: 590 Ohm
Lead Channel Impedance Value: 690 Ohm
Lead Channel Pacing Threshold Amplitude: 0.75 V
Lead Channel Pacing Threshold Amplitude: 0.75 V
Lead Channel Pacing Threshold Amplitude: 1.75 V
Lead Channel Pacing Threshold Pulse Width: 0.5 ms
Lead Channel Pacing Threshold Pulse Width: 0.5 ms
Lead Channel Pacing Threshold Pulse Width: 1 ms
Lead Channel Sensing Intrinsic Amplitude: 12 mV
Lead Channel Sensing Intrinsic Amplitude: 5 mV
Lead Channel Setting Pacing Amplitude: 2 V
Lead Channel Setting Pacing Amplitude: 2.5 V
Lead Channel Setting Pacing Amplitude: 2.5 V
Lead Channel Setting Pacing Pulse Width: 0.5 ms
Lead Channel Setting Pacing Pulse Width: 0.5 ms
Lead Channel Setting Sensing Sensitivity: 2 mV
Pulse Gen Model: 3562
Pulse Gen Serial Number: 3933180

## 2021-05-01 DIAGNOSIS — M10162 Lead-induced gout, left knee: Secondary | ICD-10-CM | POA: Diagnosis not present

## 2021-05-01 DIAGNOSIS — T560X1A Toxic effect of lead and its compounds, accidental (unintentional), initial encounter: Secondary | ICD-10-CM | POA: Diagnosis not present

## 2021-05-12 NOTE — Progress Notes (Signed)
Remote pacemaker transmission.   

## 2021-05-16 DIAGNOSIS — M1712 Unilateral primary osteoarthritis, left knee: Secondary | ICD-10-CM | POA: Diagnosis not present

## 2021-05-29 DIAGNOSIS — I5022 Chronic systolic (congestive) heart failure: Secondary | ICD-10-CM | POA: Diagnosis not present

## 2021-05-29 DIAGNOSIS — E782 Mixed hyperlipidemia: Secondary | ICD-10-CM | POA: Diagnosis not present

## 2021-05-29 DIAGNOSIS — I480 Paroxysmal atrial fibrillation: Secondary | ICD-10-CM | POA: Diagnosis not present

## 2021-07-20 ENCOUNTER — Telehealth: Payer: Self-pay | Admitting: *Deleted

## 2021-07-20 DIAGNOSIS — I480 Paroxysmal atrial fibrillation: Secondary | ICD-10-CM | POA: Diagnosis not present

## 2021-07-20 DIAGNOSIS — I255 Ischemic cardiomyopathy: Secondary | ICD-10-CM | POA: Diagnosis not present

## 2021-07-20 DIAGNOSIS — R7989 Other specified abnormal findings of blood chemistry: Secondary | ICD-10-CM | POA: Diagnosis not present

## 2021-07-20 LAB — BASIC METABOLIC PANEL
BUN/Creatinine Ratio: 18 (ref 10–24)
BUN: 28 mg/dL — ABNORMAL HIGH (ref 8–27)
CO2: 25 mmol/L (ref 20–29)
Calcium: 9 mg/dL (ref 8.6–10.2)
Chloride: 97 mmol/L (ref 96–106)
Creatinine, Ser: 1.53 mg/dL — ABNORMAL HIGH (ref 0.76–1.27)
Glucose: 136 mg/dL — ABNORMAL HIGH (ref 70–99)
Potassium: 3.6 mmol/L (ref 3.5–5.2)
Sodium: 140 mmol/L (ref 134–144)
eGFR: 45 mL/min/{1.73_m2} — ABNORMAL LOW (ref >59)

## 2021-07-20 LAB — TSH: TSH: 6.54 u[IU]/mL — ABNORMAL HIGH (ref 0.450–4.500)

## 2021-07-20 LAB — T3, FREE: T3, Free: 1.8 pg/mL — ABNORMAL LOW (ref 2.0–4.4)

## 2021-07-20 LAB — T4, FREE: Free T4: 1.57 ng/dL (ref 0.82–1.77)

## 2021-07-20 MED ORDER — TORSEMIDE 20 MG PO TABS: 20.0000 mg | ORAL_TABLET | Freq: Three times a day (TID) | ORAL | 2 refills | Status: AC

## 2021-07-20 NOTE — Telephone Encounter (Signed)
Pt came in for blood work and stated he needs a refill for his Torsemide. He is taking it three times daily and says this is per Dr. [name]. Sent refill in for three times daily, 90 day supply with 2 refills.

## 2021-07-26 ENCOUNTER — Ambulatory Visit (INDEPENDENT_AMBULATORY_CARE_PROVIDER_SITE_OTHER): Payer: Medicare Other

## 2021-07-26 DIAGNOSIS — I255 Ischemic cardiomyopathy: Secondary | ICD-10-CM

## 2021-07-31 LAB — CUP PACEART REMOTE DEVICE CHECK
Battery Remaining Longevity: 81 mo
Battery Remaining Percentage: 88 %
Battery Voltage: 3.01 V
Brady Statistic AP VP Percent: 52 %
Brady Statistic AP VS Percent: 1 %
Brady Statistic AS VP Percent: 47 %
Brady Statistic AS VS Percent: 1 %
Brady Statistic RA Percent Paced: 52 %
Date Time Interrogation Session: 20230713114549
Implantable Lead Implant Date: 20220712
Implantable Lead Implant Date: 20220712
Implantable Lead Implant Date: 20220712
Implantable Lead Location: 753858
Implantable Lead Location: 753859
Implantable Lead Location: 753860
Implantable Pulse Generator Implant Date: 20220712
Lead Channel Impedance Value: 510 Ohm
Lead Channel Impedance Value: 640 Ohm
Lead Channel Impedance Value: 680 Ohm
Lead Channel Pacing Threshold Amplitude: 0.75 V
Lead Channel Pacing Threshold Amplitude: 0.75 V
Lead Channel Pacing Threshold Amplitude: 1.75 V
Lead Channel Pacing Threshold Pulse Width: 0.5 ms
Lead Channel Pacing Threshold Pulse Width: 0.5 ms
Lead Channel Pacing Threshold Pulse Width: 1 ms
Lead Channel Sensing Intrinsic Amplitude: 12 mV
Lead Channel Sensing Intrinsic Amplitude: 5 mV
Lead Channel Setting Pacing Amplitude: 2 V
Lead Channel Setting Pacing Amplitude: 2.5 V
Lead Channel Setting Pacing Amplitude: 2.5 V
Lead Channel Setting Pacing Pulse Width: 0.5 ms
Lead Channel Setting Pacing Pulse Width: 0.5 ms
Lead Channel Setting Sensing Sensitivity: 2 mV
Pulse Gen Model: 3562
Pulse Gen Serial Number: 3933180

## 2021-08-07 ENCOUNTER — Other Ambulatory Visit: Payer: Self-pay | Admitting: Cardiology

## 2021-08-07 DIAGNOSIS — I110 Hypertensive heart disease with heart failure: Secondary | ICD-10-CM

## 2021-08-07 DIAGNOSIS — Z79899 Other long term (current) drug therapy: Secondary | ICD-10-CM

## 2021-08-07 DIAGNOSIS — I447 Left bundle-branch block, unspecified: Secondary | ICD-10-CM

## 2021-08-07 DIAGNOSIS — I5022 Chronic systolic (congestive) heart failure: Secondary | ICD-10-CM

## 2021-08-07 DIAGNOSIS — Z7901 Long term (current) use of anticoagulants: Secondary | ICD-10-CM

## 2021-08-07 DIAGNOSIS — I480 Paroxysmal atrial fibrillation: Secondary | ICD-10-CM

## 2021-08-15 NOTE — Progress Notes (Signed)
Remote pacemaker transmission.   

## 2021-08-17 DIAGNOSIS — M1712 Unilateral primary osteoarthritis, left knee: Secondary | ICD-10-CM | POA: Diagnosis not present

## 2021-08-30 DIAGNOSIS — N401 Enlarged prostate with lower urinary tract symptoms: Secondary | ICD-10-CM | POA: Diagnosis not present

## 2021-08-30 DIAGNOSIS — R350 Frequency of micturition: Secondary | ICD-10-CM | POA: Diagnosis not present

## 2021-08-30 DIAGNOSIS — I10 Essential (primary) hypertension: Secondary | ICD-10-CM | POA: Diagnosis not present

## 2021-08-30 DIAGNOSIS — Z1331 Encounter for screening for depression: Secondary | ICD-10-CM | POA: Diagnosis not present

## 2021-08-30 DIAGNOSIS — Z131 Encounter for screening for diabetes mellitus: Secondary | ICD-10-CM | POA: Diagnosis not present

## 2021-08-30 DIAGNOSIS — E782 Mixed hyperlipidemia: Secondary | ICD-10-CM | POA: Diagnosis not present

## 2021-08-30 DIAGNOSIS — Z0000 Encounter for general adult medical examination without abnormal findings: Secondary | ICD-10-CM | POA: Diagnosis not present

## 2021-08-30 DIAGNOSIS — I25118 Atherosclerotic heart disease of native coronary artery with other forms of angina pectoris: Secondary | ICD-10-CM | POA: Diagnosis not present

## 2021-08-30 DIAGNOSIS — I255 Ischemic cardiomyopathy: Secondary | ICD-10-CM | POA: Diagnosis not present

## 2021-08-30 DIAGNOSIS — Z6827 Body mass index (BMI) 27.0-27.9, adult: Secondary | ICD-10-CM | POA: Diagnosis not present

## 2021-09-07 DIAGNOSIS — E860 Dehydration: Secondary | ICD-10-CM | POA: Diagnosis not present

## 2021-10-09 DIAGNOSIS — R899 Unspecified abnormal finding in specimens from other organs, systems and tissues: Secondary | ICD-10-CM | POA: Diagnosis not present

## 2021-10-12 ENCOUNTER — Telehealth: Payer: Self-pay | Admitting: Cardiology

## 2021-10-12 NOTE — Telephone Encounter (Signed)
Pt c/o Shortness Of Breath: STAT if SOB developed within the last 24 hours or pt is noticeably SOB on the phone  1. Are you currently SOB (can you hear that pt is SOB on the phone)? yes  2. How long have you been experiencing SOB? For so months   3. Are you SOB when sitting or when up moving around? Both  4. Are you currently experiencing any other symptoms? no

## 2021-10-12 NOTE — Telephone Encounter (Signed)
Called patient and he reported that he is short of breath and it has been happening for the past month and he feels like it is getting worse. He is short of breath with any exertion, even moving around his house and he is tired all of the time. Patient also stated that he has history of CHF. I spoke to Dr. [name] regarding this patient's symptoms and he recommended that he go to the ER to be evaluated. Patient was agreeable with this plan and had no further questions at this time.

## 2021-10-13 DIAGNOSIS — R609 Edema, unspecified: Secondary | ICD-10-CM | POA: Diagnosis not present

## 2021-10-13 DIAGNOSIS — N179 Acute kidney failure, unspecified: Secondary | ICD-10-CM | POA: Diagnosis not present

## 2021-10-13 DIAGNOSIS — J9620 Acute and chronic respiratory failure, unspecified whether with hypoxia or hypercapnia: Secondary | ICD-10-CM | POA: Diagnosis not present

## 2021-10-13 DIAGNOSIS — Z7901 Long term (current) use of anticoagulants: Secondary | ICD-10-CM | POA: Diagnosis not present

## 2021-10-13 DIAGNOSIS — R0602 Shortness of breath: Secondary | ICD-10-CM | POA: Diagnosis not present

## 2021-10-13 DIAGNOSIS — I2510 Atherosclerotic heart disease of native coronary artery without angina pectoris: Secondary | ICD-10-CM | POA: Diagnosis not present

## 2021-10-13 DIAGNOSIS — J8410 Pulmonary fibrosis, unspecified: Secondary | ICD-10-CM | POA: Diagnosis not present

## 2021-10-13 DIAGNOSIS — I110 Hypertensive heart disease with heart failure: Secondary | ICD-10-CM | POA: Diagnosis not present

## 2021-10-13 DIAGNOSIS — K219 Gastro-esophageal reflux disease without esophagitis: Secondary | ICD-10-CM | POA: Diagnosis not present

## 2021-10-13 DIAGNOSIS — R42 Dizziness and giddiness: Secondary | ICD-10-CM | POA: Diagnosis not present

## 2021-10-13 DIAGNOSIS — J9601 Acute respiratory failure with hypoxia: Secondary | ICD-10-CM | POA: Diagnosis not present

## 2021-10-13 DIAGNOSIS — Z887 Allergy status to serum and vaccine status: Secondary | ICD-10-CM | POA: Diagnosis not present

## 2021-10-13 DIAGNOSIS — Z950 Presence of cardiac pacemaker: Secondary | ICD-10-CM | POA: Diagnosis not present

## 2021-10-13 DIAGNOSIS — J984 Other disorders of lung: Secondary | ICD-10-CM | POA: Diagnosis not present

## 2021-10-13 DIAGNOSIS — I351 Nonrheumatic aortic (valve) insufficiency: Secondary | ICD-10-CM | POA: Diagnosis not present

## 2021-10-13 DIAGNOSIS — I4891 Unspecified atrial fibrillation: Secondary | ICD-10-CM | POA: Diagnosis not present

## 2021-10-13 DIAGNOSIS — I509 Heart failure, unspecified: Secondary | ICD-10-CM | POA: Diagnosis not present

## 2021-10-13 DIAGNOSIS — I5042 Chronic combined systolic (congestive) and diastolic (congestive) heart failure: Secondary | ICD-10-CM | POA: Diagnosis not present

## 2021-10-13 DIAGNOSIS — K8020 Calculus of gallbladder without cholecystitis without obstruction: Secondary | ICD-10-CM | POA: Diagnosis not present

## 2021-10-13 DIAGNOSIS — Z9981 Dependence on supplemental oxygen: Secondary | ICD-10-CM | POA: Diagnosis not present

## 2021-10-13 DIAGNOSIS — I429 Cardiomyopathy, unspecified: Secondary | ICD-10-CM | POA: Diagnosis not present

## 2021-10-13 DIAGNOSIS — R0609 Other forms of dyspnea: Secondary | ICD-10-CM | POA: Diagnosis not present

## 2021-10-13 DIAGNOSIS — I252 Old myocardial infarction: Secondary | ICD-10-CM | POA: Diagnosis not present

## 2021-10-13 DIAGNOSIS — Z7982 Long term (current) use of aspirin: Secondary | ICD-10-CM | POA: Diagnosis not present

## 2021-10-13 DIAGNOSIS — Z79899 Other long term (current) drug therapy: Secondary | ICD-10-CM | POA: Diagnosis not present

## 2021-10-13 DIAGNOSIS — M1990 Unspecified osteoarthritis, unspecified site: Secondary | ICD-10-CM | POA: Diagnosis present

## 2021-10-13 DIAGNOSIS — I10 Essential (primary) hypertension: Secondary | ICD-10-CM | POA: Diagnosis not present

## 2021-10-13 DIAGNOSIS — T462X5A Adverse effect of other antidysrhythmic drugs, initial encounter: Secondary | ICD-10-CM | POA: Diagnosis not present

## 2021-10-13 DIAGNOSIS — Z87891 Personal history of nicotine dependence: Secondary | ICD-10-CM | POA: Diagnosis not present

## 2021-10-13 DIAGNOSIS — I119 Hypertensive heart disease without heart failure: Secondary | ICD-10-CM | POA: Diagnosis not present

## 2021-10-14 DIAGNOSIS — J984 Other disorders of lung: Secondary | ICD-10-CM | POA: Diagnosis not present

## 2021-10-14 DIAGNOSIS — N179 Acute kidney failure, unspecified: Secondary | ICD-10-CM | POA: Diagnosis not present

## 2021-10-14 DIAGNOSIS — I119 Hypertensive heart disease without heart failure: Secondary | ICD-10-CM | POA: Diagnosis not present

## 2021-10-14 DIAGNOSIS — J9620 Acute and chronic respiratory failure, unspecified whether with hypoxia or hypercapnia: Secondary | ICD-10-CM | POA: Diagnosis not present

## 2021-10-14 DIAGNOSIS — Z950 Presence of cardiac pacemaker: Secondary | ICD-10-CM | POA: Diagnosis not present

## 2021-10-14 DIAGNOSIS — J9601 Acute respiratory failure with hypoxia: Secondary | ICD-10-CM | POA: Diagnosis not present

## 2021-10-14 DIAGNOSIS — I351 Nonrheumatic aortic (valve) insufficiency: Secondary | ICD-10-CM | POA: Diagnosis not present

## 2021-10-14 DIAGNOSIS — I2510 Atherosclerotic heart disease of native coronary artery without angina pectoris: Secondary | ICD-10-CM | POA: Diagnosis not present

## 2021-10-14 DIAGNOSIS — Z9981 Dependence on supplemental oxygen: Secondary | ICD-10-CM | POA: Diagnosis not present

## 2021-10-14 DIAGNOSIS — I509 Heart failure, unspecified: Secondary | ICD-10-CM | POA: Diagnosis not present

## 2021-10-14 DIAGNOSIS — J8410 Pulmonary fibrosis, unspecified: Secondary | ICD-10-CM | POA: Diagnosis not present

## 2021-10-15 DIAGNOSIS — Z950 Presence of cardiac pacemaker: Secondary | ICD-10-CM | POA: Diagnosis not present

## 2021-10-15 DIAGNOSIS — J9601 Acute respiratory failure with hypoxia: Secondary | ICD-10-CM | POA: Diagnosis not present

## 2021-10-15 DIAGNOSIS — I2510 Atherosclerotic heart disease of native coronary artery without angina pectoris: Secondary | ICD-10-CM | POA: Diagnosis not present

## 2021-10-15 DIAGNOSIS — N179 Acute kidney failure, unspecified: Secondary | ICD-10-CM | POA: Diagnosis not present

## 2021-10-15 DIAGNOSIS — J984 Other disorders of lung: Secondary | ICD-10-CM | POA: Diagnosis not present

## 2021-10-15 DIAGNOSIS — I119 Hypertensive heart disease without heart failure: Secondary | ICD-10-CM | POA: Diagnosis not present

## 2021-10-17 DIAGNOSIS — J84112 Idiopathic pulmonary fibrosis: Secondary | ICD-10-CM | POA: Diagnosis not present

## 2021-10-18 DIAGNOSIS — I5023 Acute on chronic systolic (congestive) heart failure: Secondary | ICD-10-CM | POA: Diagnosis not present

## 2021-10-18 DIAGNOSIS — I429 Cardiomyopathy, unspecified: Secondary | ICD-10-CM | POA: Diagnosis not present

## 2021-10-18 DIAGNOSIS — J8410 Pulmonary fibrosis, unspecified: Secondary | ICD-10-CM | POA: Diagnosis not present

## 2021-10-18 DIAGNOSIS — J9601 Acute respiratory failure with hypoxia: Secondary | ICD-10-CM | POA: Diagnosis not present

## 2021-10-25 ENCOUNTER — Ambulatory Visit (INDEPENDENT_AMBULATORY_CARE_PROVIDER_SITE_OTHER): Payer: Medicare Other

## 2021-10-25 DIAGNOSIS — I255 Ischemic cardiomyopathy: Secondary | ICD-10-CM

## 2021-10-25 LAB — CUP PACEART REMOTE DEVICE CHECK
Battery Remaining Longevity: 73 mo
Battery Remaining Percentage: 85 %
Battery Voltage: 3.02 V
Brady Statistic AP VP Percent: 50 %
Brady Statistic AP VS Percent: 1 %
Brady Statistic AS VP Percent: 50 %
Brady Statistic AS VS Percent: 1 %
Brady Statistic RA Percent Paced: 49 %
Date Time Interrogation Session: 20231011040355
Implantable Lead Implant Date: 20220712
Implantable Lead Implant Date: 20220712
Implantable Lead Implant Date: 20220712
Implantable Lead Location: 753858
Implantable Lead Location: 753859
Implantable Lead Location: 753860
Implantable Pulse Generator Implant Date: 20220712
Lead Channel Impedance Value: 440 Ohm
Lead Channel Impedance Value: 560 Ohm
Lead Channel Impedance Value: 630 Ohm
Lead Channel Pacing Threshold Amplitude: 0.75 V
Lead Channel Pacing Threshold Amplitude: 0.75 V
Lead Channel Pacing Threshold Amplitude: 1.75 V
Lead Channel Pacing Threshold Pulse Width: 0.5 ms
Lead Channel Pacing Threshold Pulse Width: 0.5 ms
Lead Channel Pacing Threshold Pulse Width: 1 ms
Lead Channel Sensing Intrinsic Amplitude: 12 mV
Lead Channel Sensing Intrinsic Amplitude: 4.7 mV
Lead Channel Setting Pacing Amplitude: 2 V
Lead Channel Setting Pacing Amplitude: 2.5 V
Lead Channel Setting Pacing Amplitude: 2.5 V
Lead Channel Setting Pacing Pulse Width: 0.5 ms
Lead Channel Setting Pacing Pulse Width: 0.5 ms
Lead Channel Setting Sensing Sensitivity: 2 mV
Pulse Gen Model: 3562
Pulse Gen Serial Number: 3933180

## 2021-10-30 ENCOUNTER — Encounter: Payer: Self-pay | Admitting: Cardiology

## 2021-10-30 ENCOUNTER — Other Ambulatory Visit: Payer: Self-pay

## 2021-10-30 ENCOUNTER — Ambulatory Visit: Payer: Medicare Other | Attending: Cardiology | Admitting: Cardiology

## 2021-10-30 VITALS — BP 136/60 | HR 75 | Ht 72.0 in | Wt 206.0 lb

## 2021-10-30 DIAGNOSIS — J9611 Chronic respiratory failure with hypoxia: Secondary | ICD-10-CM | POA: Diagnosis not present

## 2021-10-30 DIAGNOSIS — I484 Atypical atrial flutter: Secondary | ICD-10-CM | POA: Diagnosis not present

## 2021-10-30 DIAGNOSIS — R051 Acute cough: Secondary | ICD-10-CM | POA: Diagnosis not present

## 2021-10-30 DIAGNOSIS — J84112 Idiopathic pulmonary fibrosis: Secondary | ICD-10-CM | POA: Diagnosis not present

## 2021-10-30 DIAGNOSIS — D6869 Other thrombophilia: Secondary | ICD-10-CM | POA: Diagnosis not present

## 2021-10-30 DIAGNOSIS — I255 Ischemic cardiomyopathy: Secondary | ICD-10-CM

## 2021-10-30 DIAGNOSIS — I5042 Chronic combined systolic (congestive) and diastolic (congestive) heart failure: Secondary | ICD-10-CM | POA: Insufficient documentation

## 2021-10-30 LAB — CUP PACEART INCLINIC DEVICE CHECK
Battery Remaining Longevity: 66 mo
Battery Voltage: 3.02 V
Brady Statistic RA Percent Paced: 49 %
Brady Statistic RV Percent Paced: 99.7 %
Date Time Interrogation Session: 20231016104426
Implantable Lead Implant Date: 20220712
Implantable Lead Implant Date: 20220712
Implantable Lead Implant Date: 20220712
Implantable Lead Location: 753858
Implantable Lead Location: 753859
Implantable Lead Location: 753860
Implantable Pulse Generator Implant Date: 20220712
Lead Channel Impedance Value: 462.5 Ohm
Lead Channel Impedance Value: 600 Ohm
Lead Channel Impedance Value: 637.5 Ohm
Lead Channel Pacing Threshold Amplitude: 0.75 V
Lead Channel Pacing Threshold Amplitude: 0.75 V
Lead Channel Pacing Threshold Amplitude: 1 V
Lead Channel Pacing Threshold Amplitude: 1 V
Lead Channel Pacing Threshold Amplitude: 2 V
Lead Channel Pacing Threshold Amplitude: 2 V
Lead Channel Pacing Threshold Pulse Width: 0.5 ms
Lead Channel Pacing Threshold Pulse Width: 0.5 ms
Lead Channel Pacing Threshold Pulse Width: 0.5 ms
Lead Channel Pacing Threshold Pulse Width: 0.5 ms
Lead Channel Pacing Threshold Pulse Width: 1 ms
Lead Channel Pacing Threshold Pulse Width: 1 ms
Lead Channel Sensing Intrinsic Amplitude: 5 mV
Lead Channel Sensing Intrinsic Amplitude: 8 mV
Lead Channel Setting Pacing Amplitude: 2 V
Lead Channel Setting Pacing Amplitude: 2.5 V
Lead Channel Setting Pacing Amplitude: 2.5 V
Lead Channel Setting Pacing Pulse Width: 0.5 ms
Lead Channel Setting Pacing Pulse Width: 1 ms
Lead Channel Setting Sensing Sensitivity: 2 mV
Pulse Gen Model: 3562
Pulse Gen Serial Number: 3933180

## 2021-10-30 NOTE — Progress Notes (Signed)
Electrophysiology Office Note   Date:  10/30/2021   ID:  Glenn Sullivan, DOB October 30, 1940, MRN [id number]  PCP:  Garwin Brothers, MD  Cardiologist:  [name] Primary Electrophysiologist:  Wendell Nicoson Meredith Leeds, MD    No chief complaint on file.     History of Present Illness: Glenn Sullivan is a 81 y.o. male who is being seen today for the evaluation of CHF at the request of Garwin Brothers, MD. Presenting today for electrophysiology evaluation.  He has a history seen for coronary artery disease, chronic systolic heart failure, hypertension, left bundle branch block.  He is currently on Entresto and carvedilol.  He was noted to be in atrial fibrillation and felt quite poorly.  He was started on amiodarone.  He is now status post [place] CRT-P implanted 07/26/2020.  Today, denies symptoms of palpitations, chest pain,  orthopnea, PND, lower extremity edema, claudication, dizziness, presyncope, syncope, bleeding, or neurologic sequela. The patient is tolerating medications without difficulties.  Short of breath over the last few weeks.  He presented to [facility] due to shortness of breath.  It was unclear as to the cause of his shortness of breath.  His amiodarone was stopped at that time as it was thought that his shortness of breath may be related to pulmonary toxicity.   Past Medical History:  Diagnosis Date   AAA (abdominal aortic aneurysm) without rupture ([place]) 12/24/2013   Barrett's esophagus    Cardiomyopathy ([place]) 03/07/2018   EF 35% in 2017   Chronic combined systolic and diastolic CHF (congestive heart failure) ([place]) 04/25/2018   Coronary artery disease 03/07/2018   Left heart cath 2017:Conclusions Peripheral Procedure Description Patent stent graft in abdominal aorta Patent Renal artery stents Diagnostic Procedure Summary Diffuse Mild to Moderate calcific coronary artery disease. IFR in the RCA was 1.0 after hyperemic response with adenosine. Moderate global LV systolic dysfunction.  LV ejection fraction is 35% Diagnostic Procedure Recommendations Medical the   Diarrhea 04/28/2018   Dyslipidemia 10/28/2015   H/O aortic aneurysm repair 10/28/2015   Hypertensive heart disease with heart failure ([place]) 03/07/2018   LBBB (left bundle branch block) 04/25/2018   QRSD 180 msec   Myocardial infarction [facility])    Past Surgical History:  Procedure Laterality Date   ABDOMINAL AORTIC ANEURYSM REPAIR     BIV PACEMAKER INSERTION CRT-P N/A 07/26/2020   Procedure: BIV PACEMAKER INSERTION CRT-P;  Surgeon: Constance Haw, MD;  Location: [place] [facility];  Service: Cardiovascular;  Laterality: N/A;   EVAR     RENAL ARTERY STENT     RIGHT/LEFT HEART CATH AND CORONARY ANGIOGRAPHY N/A 05/16/2020   Procedure: RIGHT/LEFT HEART CATH AND CORONARY ANGIOGRAPHY;  Surgeon: Leonie Man, MD;  Location: [place] [facility];  Service: Cardiovascular;  Laterality: N/A;     Current Outpatient Medications  Medication Sig Dispense Refill   aspirin EC 81 MG tablet Take 81 mg by mouth daily.     carvedilol (COREG) 3.125 MG tablet TAKE 1 TABLET (3.125 MG TOTAL) BY MOUTH 2 (TWO) TIMES DAILY WITH A MEAL. 180 tablet 3   ELIQUIS 2.5 MG TABS tablet TAKE 1 TABLET BY MOUTH TWICE A DAY 180 tablet 1   ENTRESTO 97-103 MG TAKE 1 TABLET BY MOUTH TWICE A DAY 180 tablet 3   fluticasone (FLONASE) 50 MCG/ACT nasal spray Place 1 spray into both nostrils daily as needed for allergies.     omeprazole (PRILOSEC) 20 MG capsule Take 20 mg by mouth daily.  torsemide (DEMADEX) 20 MG tablet Take 1 tablet (20 mg total) by mouth in the morning, at noon, and at bedtime. 270 tablet 2   nitroGLYCERIN (NITROSTAT) 0.4 MG SL tablet Place 1 tablet (0.4 mg total) under the tongue every 5 (five) minutes as needed. 30 tablet 3   No current facility-administered medications for this visit.    Allergies:   Farxiga [dapagliflozin], Tetanus toxoids, and Lisinopril   Social History:  The patient  reports that he quit smoking about 14  years ago. His smoking use included cigarettes. He has been exposed to tobacco smoke. He has never used smokeless tobacco. He reports current alcohol use. He reports that he does not use drugs.   Family History:  The patient's family history includes Diabetes in his mother; Heart disease in his mother; Hypertension in his mother; Varicose Veins in his mother.   ROS:  Please see the history of present illness.   Otherwise, review of systems is positive for none.   All other systems are reviewed and negative.   PHYSICAL EXAM: VS:  BP 136/60   Pulse 75   Ht 6' (1.829 m)   Wt 206 lb (93.4 kg)   SpO2 (!) 87%   BMI 27.94 kg/m  , BMI Body mass index is 27.94 kg/m. GEN: Well nourished, well developed, in no acute distress  HEENT: normal  Neck: no JVD, carotid bruits, or masses Cardiac: RRR; no murmurs, rubs, or gallops,no edema  Respiratory:  clear to auscultation bilaterally, normal work of breathing GI: soft, nontender, nondistended, + BS MS: no deformity or atrophy  Skin: warm and dry, device site well healed Neuro:  Strength and sensation are intact Psych: euthymic mood, full affect  EKG:  EKG is ordered today. Personal review of the ekg ordered shows atrial sensed, ventricular paced  Personal review of the device interrogation today. Results in [place]: 04/17/2021: ALT 24; NT-Pro BNP 1,170 07/20/2021: BUN 28; Creatinine, Ser 1.53; Potassium 3.6; Sodium 140; TSH 6.540    Lipid Panel     Component Value Date/Time   CHOL 180 04/17/2021 1352   TRIG 96 04/17/2021 1352   HDL 78 04/17/2021 1352   CHOLHDL 2.3 04/17/2021 1352   LDLCALC 85 04/17/2021 1352     Wt Readings from Last 3 Encounters:  10/30/21 206 lb (93.4 kg)  04/17/21 213 lb (96.6 kg)  02/14/21 214 lb 12.8 oz (97.4 kg)      Other studies Reviewed: Additional studies/ records that were reviewed today include: TTE 03/13/2018 Review of the above records today demonstrates:   1. The left ventricle has  severely reduced systolic function, with an ejection fraction of 20-25%. The cavity size was normal. Left ventricular diastolic Doppler parameters are consistent with pseudonormalization.  2. The right ventricle has normal systolic function. The cavity was normal. There is no increase in right ventricular wall thickness.  3. Left atrial size was mildly dilated.  4. The mitral valve is normal in structure. Mitral valve regurgitation is mild to moderate by color flow Doppler.  5. The tricuspid valve is normal in structure.  6. The aortic valve is tricuspid Mild calcification of the aortic valve.  7. The pulmonic valve was normal in structure.  8. Severe akinesis of the left ventricular septal.  Left heart catheterization 5-22 Ost LM to Mid LM lesion is 30% stenosed. Mid LM to Dist LM lesion is 55% stenosed. Prox Cx lesion is 30% stenosed. Ost RCA lesion is 55% stenosed. ------------------------------------------------- LV  end diastolic pressure is moderately elevated. Hemodynamic findings consistent with moderate pulmonary hypertension.  Cardiac monitor 06/22/2020 personally reviewed Predominant rhythm was sinus rhythm Up to 25% burden of atrial fibrillation and atrial flutter Rare ventricular ectopy Pauses of up to 3.8 seconds during atrial fibrillation/flutter  ASSESSMENT AND PLAN:  1.  Chronic systolic heart failure: Due to ischemic cardiomyopathy.  NYHA class II symptoms.  Currently on Entresto 97/103 mg twice daily, carvedilol 3.125 mg twice daily.  Status post [facility] CRT-P implanted 07/26/2020.  Functioning appropriately.  2.  Coronary artery disease: No current chest pain.  Continue with plan per primary cardiology.  3.  Hyperlipidemia: Continue statin per primary cardiology  4.  Atrial flutter: Appears atypical.  Amiodarone has been stopped due to lung issues.  We Tremain Rucinski continue to hold amiodarone unless he has further arrhythmias.  5.  Secondary hypercoagulable state:  Currently on Eliquis for atrial flutter as above.  Current medicines are reviewed at length with the patient today.   The patient does not have concerns regarding his medicines.  The following changes were made today: None  Labs/ tests ordered today include:  Orders Placed This Encounter  Procedures   CUP [place]   EKG 12-Lead       Disposition:   FU with Leinani [place] 12 months   Signed, Cyani Kallstrom Meredith Leeds, MD  10/30/2021 10:50 AM     [facility] [address] [place] [address] [phone] (office) [phone] (fax)

## 2021-10-31 ENCOUNTER — Ambulatory Visit: Payer: Medicare Other | Attending: Cardiology | Admitting: Cardiology

## 2021-10-31 ENCOUNTER — Encounter: Payer: Self-pay | Admitting: Cardiology

## 2021-10-31 VITALS — BP 144/56 | HR 80 | Ht 73.0 in | Wt 206.6 lb

## 2021-10-31 DIAGNOSIS — Z950 Presence of cardiac pacemaker: Secondary | ICD-10-CM | POA: Insufficient documentation

## 2021-10-31 DIAGNOSIS — D6869 Other thrombophilia: Secondary | ICD-10-CM | POA: Insufficient documentation

## 2021-10-31 DIAGNOSIS — I447 Left bundle-branch block, unspecified: Secondary | ICD-10-CM | POA: Diagnosis not present

## 2021-10-31 DIAGNOSIS — I255 Ischemic cardiomyopathy: Secondary | ICD-10-CM | POA: Insufficient documentation

## 2021-10-31 DIAGNOSIS — I480 Paroxysmal atrial fibrillation: Secondary | ICD-10-CM | POA: Diagnosis not present

## 2021-10-31 DIAGNOSIS — I5042 Chronic combined systolic (congestive) and diastolic (congestive) heart failure: Secondary | ICD-10-CM | POA: Diagnosis not present

## 2021-10-31 NOTE — Patient Instructions (Signed)
Medication Instructions:  Your physician recommends that you continue on your current medications as directed. Please refer to the Current Medication list given to you today.  *If you need a refill on your cardiac medications before your next appointment, please call your pharmacy*   Lab Work: NONE If you have labs (blood work) drawn today and your tests are completely normal, you will receive your results only by: MyChart Message (if you have MyChart) OR A paper copy in the mail If you have any lab test that is abnormal or we need to change your treatment, we will call you to review the results.   Testing/Procedures: NONE   Follow-Up: At [facility], you and your health needs are our priority.  As part of our continuing mission to provide you with exceptional heart care, we have created designated Provider Care Teams.  These Care Teams include your primary Cardiologist (physician) and Advanced Practice Providers (APPs -  Physician Assistants and Nurse Practitioners) who all work together to provide you with the care you need, when you need it.  We recommend signing up for the patient portal called "MyChart".  Sign up information is provided on this After Visit Summary.  MyChart is used to connect with patients for Virtual Visits (Telemedicine).  Patients are able to view lab/test results, encounter notes, upcoming appointments, etc.  Non-urgent messages can be sent to your provider as well.   To learn more about what you can do with MyChart, go to [email or web address].    Your next appointment:   4 month(s)  The format for your next appointment:   In Person  Provider:   Brian Munley, MD    Other Instructions   Important Information About Sugar       

## 2021-10-31 NOTE — Progress Notes (Signed)
Cardiology Office Note:    Date:  10/31/2021   ID:  Glenn Sullivan, DOB [birth year], MRN [id number]  PCP:  Garwin Brothers, MD  Cardiologist:  Shirlee More, MD    Referring MD: Garwin Brothers, MD    ASSESSMENT:    1. Chronic combined systolic and diastolic CHF (congestive heart failure) (HCC)   2. Cardiomyopathy, ischemic   3. LBBB (left bundle branch block)   4. Status post biventricular pacemaker   5. Paroxysmal atrial fibrillation (HCC)   6. Secondary hypercoagulable state ([place])    PLAN:    In order of problems listed above:  Although his heart failure is improved EF is normalized no fluid overload is on good guideline directed therapy he has deteriorated due to primary lung disease.  His heart failure is compensated he will continue carvedilol Entresto and his loop diuretic.  His predominant problem is pulmonary fibrosis interstitial lung disease amiodarone was discontinued but his pulmonologist feels it was on related he is being managed by pulmonary awaiting a new prescription and is on ambulatory oxygen his wife tells me he has saturations in the 70s with any activity Stable from EP perspective 100% ventricularly paced biventricular with normalization of ejection fraction Stable CAD on both aspirin and Eliquis Maintaining sinus rhythm off his antiarrhythmic drug amiodarone   Next appointment: 4 months   Medication Adjustments/Labs and Tests Ordered: Current medicines are reviewed at length with the patient today.  Concerns regarding medicines are outlined above.  No orders of the defined types were placed in this encounter.  No orders of the defined types were placed in this encounter.   Chief Complaint  Patient presents with   Follow-up   Congestive Heart Failure   Cardiomyopathy   Coronary Artery Disease    History of Present Illness:    Glenn Sullivan is a 81 y.o. male with a hx of coronary artery disease heart failure hypertensive heart disease hyperlipidemia left  bundle branch with CRT/P device implanted 07/26/2020 block peripheral arterial disease last seen in the office 04/17/2021 but recently seen in [facility] with respiratory failure on amiodarone.  He is seen by pulmonary felt he had pulmonary fibrosis unrelated to amiodarone I disagreed and I stop the drug and actually gave him prednisone while in hospital.  His echo trifurcation and mild diffuse disease in the left coronary artery and left main and calcified ostial LAD 50 to 60% stenosis.  cardiogram showed a normal ejection fraction he was relatively hypovolemic on presentation to the hospital as his outpatient diuretics have been increased and he had no findings to suggest congestive heart failure.He underwent coronary angiography showing heavily calcified left main diffuse disease with 50% distal stenosis just prior to theHe was seen by cardiothoracic surgery but adamantly declined surgical intervention for his multivessel CAD.   Compliance with diet, lifestyle and medications: Yes  In hospital I have initiated steroids think he had amiodarone toxicity it was not continued Awaiting a new prescription from pulmonary?  Immunosuppressive agent for interstitial lung disease He is not doing well if anything he is more short of breath and has marked desaturation with any activity. His wife is very meticulous no edema orthopnea chest pain palpitation or syncope  Past Medical History:  Diagnosis Date   AAA (abdominal aortic aneurysm) without rupture ([place]) 12/24/2013   Barrett's esophagus    Cardiomyopathy ([place]) 03/07/2018   EF 35% in 2017   Chronic combined systolic and diastolic CHF (congestive heart failure) ([place]) 04/25/2018  Coronary artery disease 03/07/2018   Left heart cath 2017:Conclusions Peripheral Procedure Description Patent stent graft in abdominal aorta Patent Renal artery stents Diagnostic Procedure Summary Diffuse Mild to Moderate calcific coronary artery disease. IFR in the RCA was  1.0 after hyperemic response with adenosine. Moderate global LV systolic dysfunction. LV ejection fraction is 35% Diagnostic Procedure Recommendations Medical the   Diarrhea 04/28/2018   Dyslipidemia 10/28/2015   H/O aortic aneurysm repair 10/28/2015   Hypertensive heart disease with heart failure ([place]) 03/07/2018   LBBB (left bundle branch block) 04/25/2018   QRSD 180 msec   Myocardial infarction [facility])     Past Surgical History:  Procedure Laterality Date   ABDOMINAL AORTIC ANEURYSM REPAIR     BIV PACEMAKER INSERTION CRT-P N/A 07/26/2020   Procedure: BIV PACEMAKER INSERTION CRT-P;  Surgeon: Constance Haw, MD;  Location: [facility];  Service: Cardiovascular;  Laterality: N/A;   EVAR     RENAL ARTERY STENT     RIGHT/LEFT HEART CATH AND CORONARY ANGIOGRAPHY N/A 05/16/2020   Procedure: RIGHT/LEFT HEART CATH AND CORONARY ANGIOGRAPHY;  Surgeon: Leonie Man, MD;  Location: [facility];  Service: Cardiovascular;  Laterality: N/A;    Current Medications: Current Meds  Medication Sig   aspirin EC 81 MG tablet Take 81 mg by mouth daily.   carvedilol (COREG) 3.125 MG tablet TAKE 1 TABLET (3.125 MG TOTAL) BY MOUTH 2 (TWO) TIMES DAILY WITH A MEAL.   ELIQUIS 2.5 MG TABS tablet TAKE 1 TABLET BY MOUTH TWICE A DAY   ENTRESTO 97-103 MG TAKE 1 TABLET BY MOUTH TWICE A DAY   fluticasone (FLONASE) 50 MCG/ACT nasal spray Place 1 spray into both nostrils daily as needed for allergies.   nitroGLYCERIN (NITROSTAT) 0.4 MG SL tablet Place 1 tablet (0.4 mg total) under the tongue every 5 (five) minutes as needed.   omeprazole (PRILOSEC) 20 MG capsule Take 20 mg by mouth daily.   torsemide (DEMADEX) 20 MG tablet Take 1 tablet (20 mg total) by mouth in the morning, at noon, and at bedtime.     Allergies:   Farxiga [dapagliflozin], Tetanus toxoids, and Lisinopril   Social History   Socioeconomic History   Marital status: Married    Spouse name: Not on file   Number of children: Not on  file   Years of education: Not on file   Highest education level: Not on file  Occupational History   Not on file  Tobacco Use   Smoking status: Former    Types: Cigarettes    Quit date: 01/16/2007    Years since quitting: 14.8    Passive exposure: Past   Smokeless tobacco: Never  Vaping Use   Vaping Use: Never used  Substance and Sexual Activity   Alcohol use: Yes    Comment: occ   Drug use: No   Sexual activity: Not on file  Other Topics Concern   Not on file  Social History Narrative   Not on file   Social Determinants of Health   Financial Resource Strain: Not on file  Food Insecurity: Not on file  Transportation Needs: Not on file  Physical Activity: Not on file  Stress: Not on file  Social Connections: Not on file     Family History: The patient's family history includes Diabetes in his mother; Heart disease in his mother; Hypertension in his mother; Varicose Veins in his mother. ROS:   Please see the history of present illness.    All  other systems reviewed and are negative.  EKGs/Labs/Other Studies Reviewed:    The following studies were reviewed today:  EKG:  EKG independently reviewed performed by EP yesterday shows intact sinus rhythm 100% biventricular paced  Recent Labs: 04/17/2021: ALT 24; NT-Pro BNP 1,170 07/20/2021: BUN 28; Creatinine, Ser 1.53; Potassium 3.6; Sodium 140; TSH 6.540  Recent Lipid Panel    Component Value Date/Time   CHOL 180 04/17/2021 1352   TRIG 96 04/17/2021 1352   HDL 78 04/17/2021 1352   CHOLHDL 2.3 04/17/2021 1352   LDLCALC 85 04/17/2021 1352    Physical Exam:    VS:  BP (!) 144/56 (BP Location: Right Arm, Patient Position: Sitting)   Pulse 80   Ht '6\' 1"'$  (1.854 m)   Wt 206 lb 9.6 oz (93.7 kg)   SpO2 (!) 88% Comment: 3 LITERS [place]  BMI 27.26 kg/m     Wt Readings from Last 3 Encounters:  10/31/21 206 lb 9.6 oz (93.7 kg)  10/30/21 206 lb (93.4 kg)  04/17/21 213 lb (96.6 kg)     GEN: He looks chronically ill well  nourished, well developed in no acute distress HEENT: Normal NECK: No JVD; No carotid bruits LYMPHATICS: No lymphadenopathy CARDIAC: Distant heart sounds RRR, no murmurs, rubs, gallops RESPIRATORY: Diminished breath sounds his end expiratory wheezing and diffuse crackles ABDOMEN: Soft, non-tender, non-distended MUSCULOSKELETAL:  No edema; No deformity  SKIN: Warm and dry NEUROLOGIC:  Alert and oriented x 3 PSYCHIATRIC:  Normal affect    Signed, Shirlee More, MD  10/31/2021 9:29 AM    [facility]

## 2021-11-06 ENCOUNTER — Telehealth: Payer: Self-pay | Admitting: Cardiology

## 2021-11-06 DIAGNOSIS — I959 Hypotension, unspecified: Secondary | ICD-10-CM | POA: Diagnosis not present

## 2021-11-06 DIAGNOSIS — I509 Heart failure, unspecified: Secondary | ICD-10-CM | POA: Diagnosis not present

## 2021-11-06 DIAGNOSIS — Z515 Encounter for palliative care: Secondary | ICD-10-CM | POA: Diagnosis not present

## 2021-11-06 DIAGNOSIS — I2510 Atherosclerotic heart disease of native coronary artery without angina pectoris: Secondary | ICD-10-CM | POA: Diagnosis not present

## 2021-11-06 DIAGNOSIS — J189 Pneumonia, unspecified organism: Secondary | ICD-10-CM | POA: Diagnosis not present

## 2021-11-06 DIAGNOSIS — R0602 Shortness of breath: Secondary | ICD-10-CM | POA: Diagnosis not present

## 2021-11-06 DIAGNOSIS — I130 Hypertensive heart and chronic kidney disease with heart failure and stage 1 through stage 4 chronic kidney disease, or unspecified chronic kidney disease: Secondary | ICD-10-CM | POA: Diagnosis not present

## 2021-11-06 DIAGNOSIS — Z66 Do not resuscitate: Secondary | ICD-10-CM | POA: Diagnosis present

## 2021-11-06 DIAGNOSIS — J440 Chronic obstructive pulmonary disease with acute lower respiratory infection: Secondary | ICD-10-CM | POA: Diagnosis not present

## 2021-11-06 DIAGNOSIS — R0689 Other abnormalities of breathing: Secondary | ICD-10-CM | POA: Diagnosis not present

## 2021-11-06 DIAGNOSIS — Z7982 Long term (current) use of aspirin: Secondary | ICD-10-CM | POA: Diagnosis not present

## 2021-11-06 DIAGNOSIS — N189 Chronic kidney disease, unspecified: Secondary | ICD-10-CM | POA: Diagnosis not present

## 2021-11-06 DIAGNOSIS — J849 Interstitial pulmonary disease, unspecified: Secondary | ICD-10-CM | POA: Diagnosis not present

## 2021-11-06 DIAGNOSIS — E785 Hyperlipidemia, unspecified: Secondary | ICD-10-CM | POA: Diagnosis present

## 2021-11-06 DIAGNOSIS — J9621 Acute and chronic respiratory failure with hypoxia: Secondary | ICD-10-CM | POA: Diagnosis not present

## 2021-11-06 DIAGNOSIS — Z9989 Dependence on other enabling machines and devices: Secondary | ICD-10-CM | POA: Diagnosis not present

## 2021-11-06 DIAGNOSIS — Z79899 Other long term (current) drug therapy: Secondary | ICD-10-CM | POA: Diagnosis not present

## 2021-11-06 DIAGNOSIS — I252 Old myocardial infarction: Secondary | ICD-10-CM | POA: Diagnosis not present

## 2021-11-06 DIAGNOSIS — M1990 Unspecified osteoarthritis, unspecified site: Secondary | ICD-10-CM | POA: Diagnosis not present

## 2021-11-06 DIAGNOSIS — I5023 Acute on chronic systolic (congestive) heart failure: Secondary | ICD-10-CM | POA: Diagnosis not present

## 2021-11-06 DIAGNOSIS — J8410 Pulmonary fibrosis, unspecified: Secondary | ICD-10-CM | POA: Diagnosis not present

## 2021-11-06 DIAGNOSIS — Z887 Allergy status to serum and vaccine status: Secondary | ICD-10-CM | POA: Diagnosis not present

## 2021-11-06 DIAGNOSIS — Z950 Presence of cardiac pacemaker: Secondary | ICD-10-CM | POA: Diagnosis not present

## 2021-11-06 DIAGNOSIS — Z7901 Long term (current) use of anticoagulants: Secondary | ICD-10-CM | POA: Diagnosis not present

## 2021-11-06 DIAGNOSIS — Z1152 Encounter for screening for COVID-19: Secondary | ICD-10-CM | POA: Diagnosis not present

## 2021-11-06 DIAGNOSIS — I420 Dilated cardiomyopathy: Secondary | ICD-10-CM | POA: Diagnosis not present

## 2021-11-06 DIAGNOSIS — J9690 Respiratory failure, unspecified, unspecified whether with hypoxia or hypercapnia: Secondary | ICD-10-CM | POA: Diagnosis not present

## 2021-11-06 DIAGNOSIS — R0902 Hypoxemia: Secondary | ICD-10-CM | POA: Diagnosis not present

## 2021-11-06 DIAGNOSIS — D649 Anemia, unspecified: Secondary | ICD-10-CM | POA: Diagnosis not present

## 2021-11-06 DIAGNOSIS — Z9981 Dependence on supplemental oxygen: Secondary | ICD-10-CM | POA: Diagnosis not present

## 2021-11-06 DIAGNOSIS — Z87891 Personal history of nicotine dependence: Secondary | ICD-10-CM | POA: Diagnosis not present

## 2021-11-06 DIAGNOSIS — J80 Acute respiratory distress syndrome: Secondary | ICD-10-CM | POA: Diagnosis not present

## 2021-11-06 NOTE — Telephone Encounter (Signed)
Please see below.

## 2021-11-06 NOTE — Telephone Encounter (Signed)
VM not setup to leave message and no answer.

## 2021-11-06 NOTE — Telephone Encounter (Signed)
Patient is calling in because is oxygen level has been mainly in the 60s with some 70s. Calling to see what recommendation are there. Please advise

## 2021-11-06 NOTE — Telephone Encounter (Signed)
Spoke with pt who reports his O2 sats have been in the 60's and 70's for the past 3-4 days. Pt is audibly SOB on the phone.  He states his O2 dropped so low earlier he lost vision in one of his eyes but it has since returned.  Pt states he wears O2 via nasal canula but is a mouth breather and needs a mask.  Pt's wife is with him.  Pt advised to have his wife call EMS for transport to the ED.  Pt states he does not want to go to the ED due to previous bad experience.  Pt advised with O2 that low he must call EMS now.  Pt verbalizes understanding and agrees with current plan.

## 2021-11-07 NOTE — Progress Notes (Signed)
Remote pacemaker transmission.   

## 2021-11-15 DEATH — deceased

## 2022-03-12 ENCOUNTER — Ambulatory Visit: Payer: Medicare Other | Admitting: Cardiology

## 2023-06-13 IMAGING — CR DG CHEST 2V
2 series · 2 of 2 positions shown · non-contrast
Comparison: April 21, 2018.

CLINICAL DATA: Postoperative images from pacemaker insertion.

EXAM:
CHEST - 2 VIEW

[w chest pa]
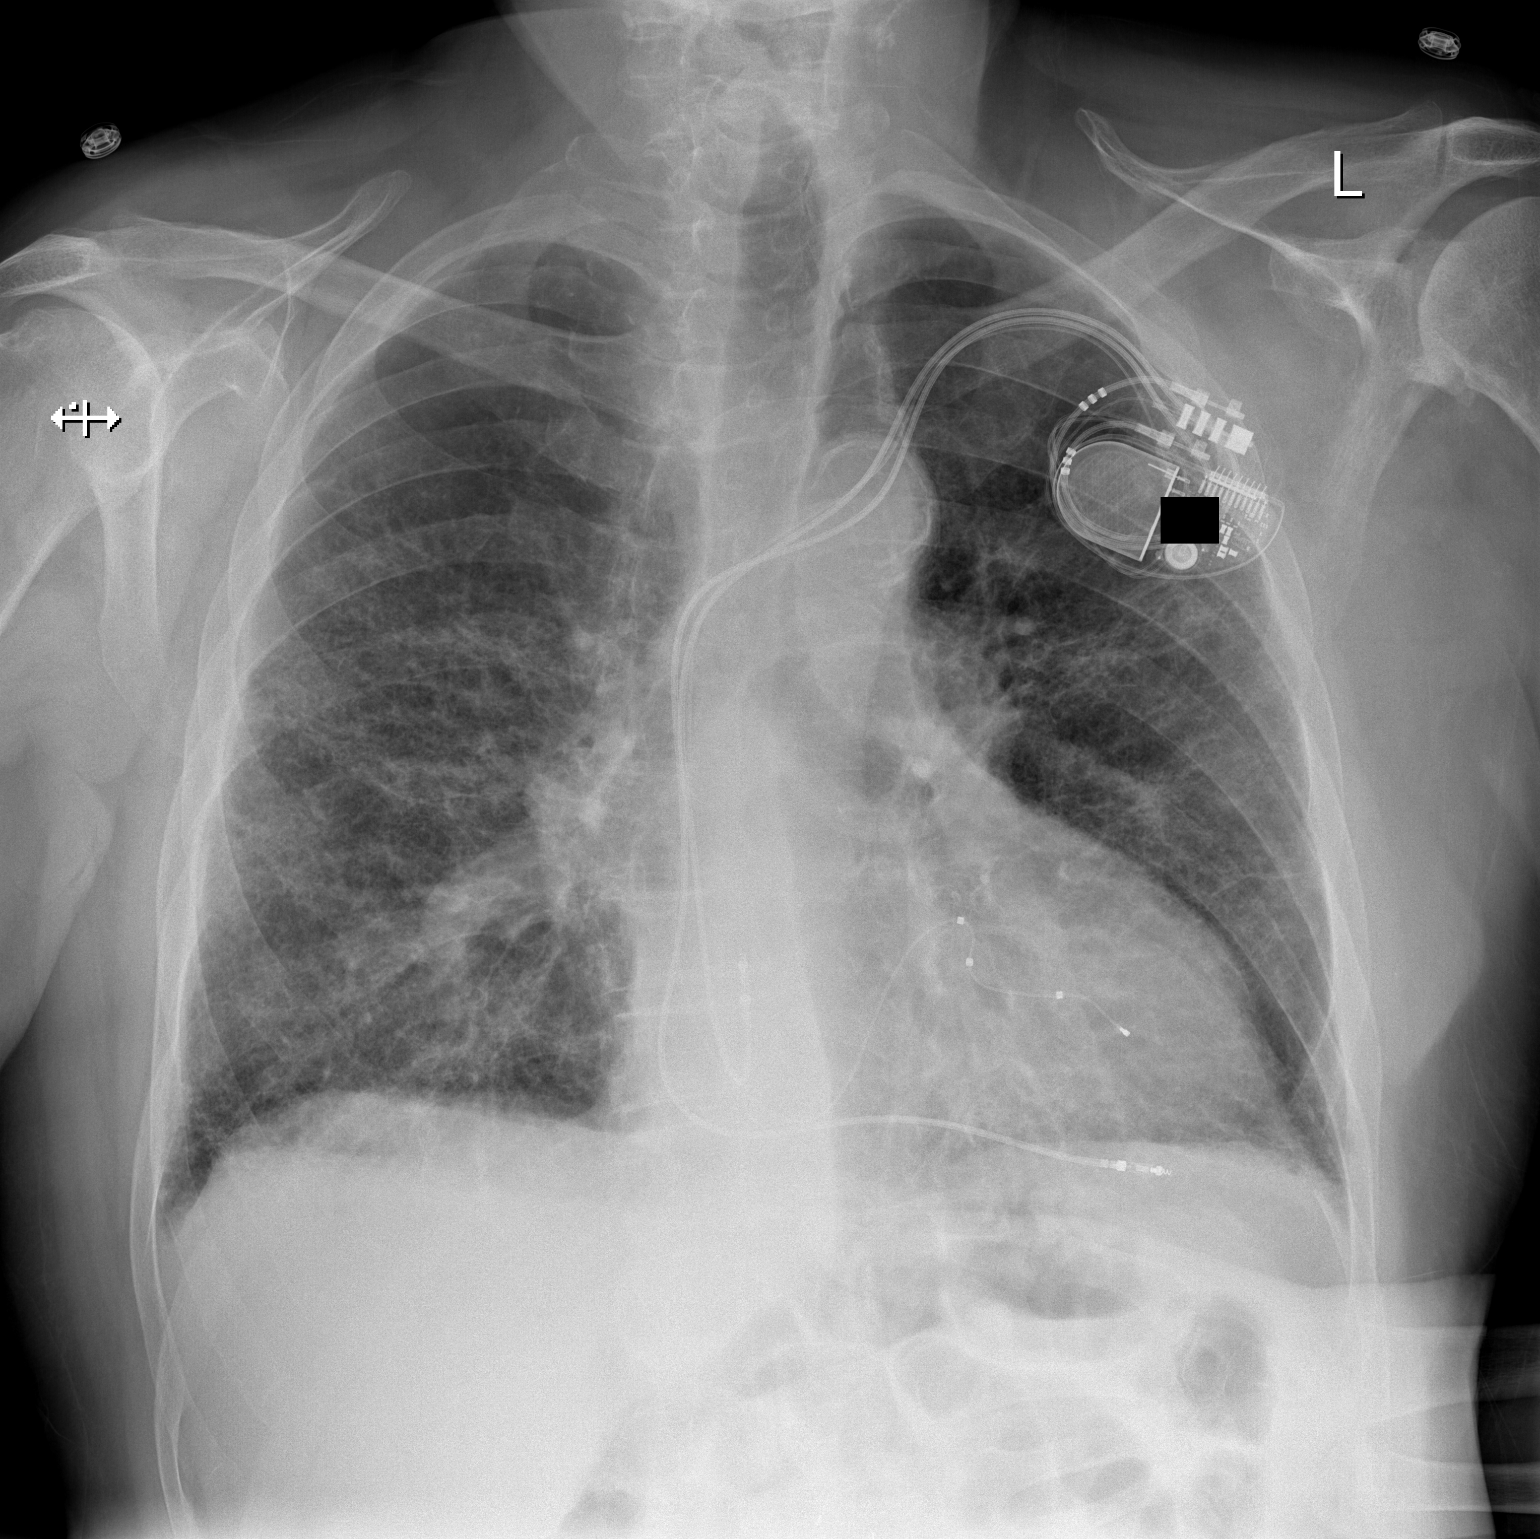

[w chest lat]
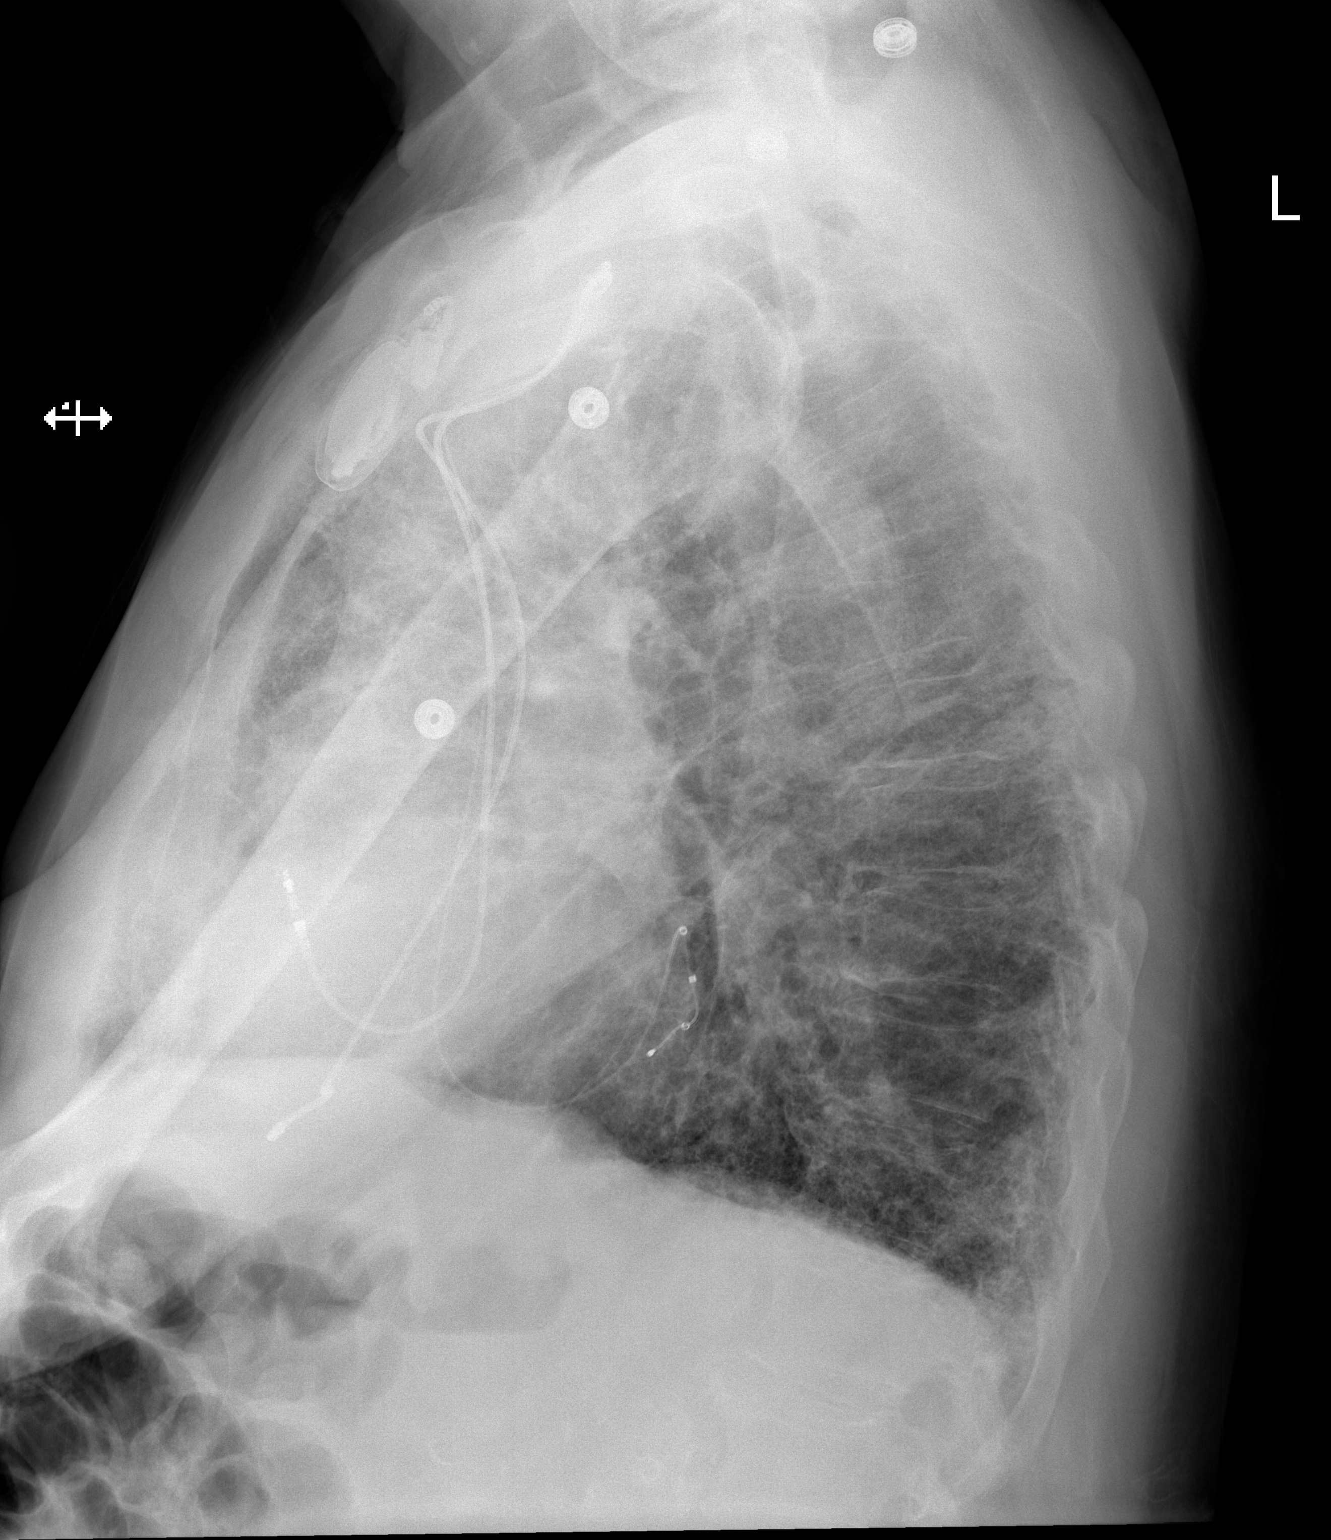

[2 of 2 positions shown; findings below may reference images not displayed]

FINDINGS: Left chest pacemaker with leads overlying the right atrium right
ventricle and coronary sinus. The heart size is slightly enlarged.
Coronary artery calcifications. Coarse bilateral interstitial
opacities without airspace disease. No visible pleural effusion or
pneumothorax. Degenerative changes shoulders and spine.
IMPRESSION: Left chest pacemaker with leads overlying the right atrium right
ventricle and coronary sinus. No visible pneumothorax.

Cardiac enlargement with chronic parenchymal lung changes.
